# Patient Record
Sex: Male | Born: 1955 | Race: White | Hispanic: No | Marital: Married | Smoking: Former smoker
Health system: Southern US, Community
[De-identification: ages and names within clinical notes are randomized; demographics above are authoritative.]

## PROBLEM LIST (undated history)

## (undated) DIAGNOSIS — N400 Enlarged prostate without lower urinary tract symptoms: Secondary | ICD-10-CM

## (undated) DIAGNOSIS — Z789 Other specified health status: Secondary | ICD-10-CM

## (undated) DIAGNOSIS — D3610 Benign neoplasm of peripheral nerves and autonomic nervous system, unspecified: Secondary | ICD-10-CM

## (undated) DIAGNOSIS — E785 Hyperlipidemia, unspecified: Secondary | ICD-10-CM

## (undated) HISTORY — DX: Benign prostatic hyperplasia without lower urinary tract symptoms: N40.0

## (undated) HISTORY — PX: COLONOSCOPY: SHX174

## (undated) HISTORY — PX: PROSTATE SURGERY: SHX751

## (undated) HISTORY — PX: ELBOW SURGERY: SHX618

## (undated) HISTORY — PX: SEPTOPLASTY: SUR1290

## (undated) HISTORY — DX: Benign neoplasm of peripheral nerves and autonomic nervous system, unspecified: D36.10

## (undated) HISTORY — PX: NASAL SINUS SURGERY: SHX719

---

## 2000-08-05 ENCOUNTER — Ambulatory Visit (HOSPITAL_BASED_OUTPATIENT_CLINIC_OR_DEPARTMENT_OTHER): Admission: RE | Admit: 2000-08-05 | Discharge: 2000-08-05 | Payer: Self-pay | Admitting: Orthopedic Surgery

## 2003-06-11 ENCOUNTER — Inpatient Hospital Stay (HOSPITAL_COMMUNITY): Admission: EM | Admit: 2003-06-11 | Discharge: 2003-06-13 | Payer: Self-pay | Admitting: Emergency Medicine

## 2007-07-30 ENCOUNTER — Ambulatory Visit: Payer: Self-pay | Admitting: Gastroenterology

## 2007-08-10 ENCOUNTER — Ambulatory Visit: Payer: Self-pay | Admitting: Gastroenterology

## 2007-08-10 ENCOUNTER — Encounter: Payer: Self-pay | Admitting: Gastroenterology

## 2007-08-11 ENCOUNTER — Encounter: Payer: Self-pay | Admitting: Gastroenterology

## 2007-09-07 ENCOUNTER — Ambulatory Visit: Payer: Self-pay | Admitting: Gastroenterology

## 2007-09-07 DIAGNOSIS — F411 Generalized anxiety disorder: Secondary | ICD-10-CM | POA: Insufficient documentation

## 2007-09-07 DIAGNOSIS — D179 Benign lipomatous neoplasm, unspecified: Secondary | ICD-10-CM | POA: Insufficient documentation

## 2007-09-07 DIAGNOSIS — R933 Abnormal findings on diagnostic imaging of other parts of digestive tract: Secondary | ICD-10-CM | POA: Insufficient documentation

## 2007-09-09 ENCOUNTER — Ambulatory Visit: Payer: Self-pay | Admitting: Internal Medicine

## 2007-09-10 ENCOUNTER — Encounter: Payer: Self-pay | Admitting: Gastroenterology

## 2008-09-09 ENCOUNTER — Telehealth (INDEPENDENT_AMBULATORY_CARE_PROVIDER_SITE_OTHER): Payer: Self-pay | Admitting: *Deleted

## 2008-09-12 ENCOUNTER — Encounter (INDEPENDENT_AMBULATORY_CARE_PROVIDER_SITE_OTHER): Payer: Self-pay | Admitting: *Deleted

## 2009-06-30 ENCOUNTER — Ambulatory Visit (HOSPITAL_COMMUNITY): Payer: Self-pay | Admitting: Licensed Clinical Social Worker

## 2009-07-05 ENCOUNTER — Ambulatory Visit (HOSPITAL_COMMUNITY): Payer: Self-pay | Admitting: Licensed Clinical Social Worker

## 2009-08-01 ENCOUNTER — Ambulatory Visit (HOSPITAL_COMMUNITY): Payer: Self-pay | Admitting: Licensed Clinical Social Worker

## 2009-10-19 ENCOUNTER — Telehealth: Payer: Self-pay | Admitting: Gastroenterology

## 2009-10-24 ENCOUNTER — Ambulatory Visit: Payer: Self-pay | Admitting: Internal Medicine

## 2009-10-27 ENCOUNTER — Ambulatory Visit: Payer: Self-pay | Admitting: Internal Medicine

## 2009-11-06 ENCOUNTER — Ambulatory Visit: Payer: Self-pay | Admitting: Gastroenterology

## 2010-03-22 NOTE — Progress Notes (Signed)
Summary: Repeat CT?  Phone Note Call from Patient Call back at Work Phone [phone]   Call For: Dr Christella Hartigan Summary of Call: Wants to know if he needs to have a Ct and will it be done that same day of his rov 10-27-09? Initial call taken by: Leanor Kail [facility],  October 19, 2009 10:03 AM  Follow-up for Phone Call        pt scheduled for CT and order faxed he will pick up contrast today Follow-up by: Chales Abrahams CMA Duncan Dull),  October 19, 2009 10:19 AM

## 2010-03-22 NOTE — Assessment & Plan Note (Signed)
  Review of gastrointestinal problems: 1. Routine risk for colon cancer; colonoscopy 2009 found no adenomatous polyps. Next colonoscopy 2019. 2. 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 Serial imaging by CT scan has shown no growth up to August 2011 (last CT).    History of Present Illness Visit Type: Follow-up Visit Primary GI MD: Rob Bunting MD Primary Provider: Bradd Canary, MD Requesting Provider: Bradd Canary, MD Chief Complaint: Discuss possible repeat CT scan History of Present Illness:     no bleeding, no constipation.  No bowel changes.  NO abd pains.  Overall has gained  about 20 pounds in 2 years.           Current Medications (verified): 1)  Adult Aspirin Ec Low Strength 81 Mg  Tbec (Aspirin) .... Take 1 Tablet By Mouth Once A Day 2)  Zocor 40 Mg  Tabs (Simvastatin) .... Take 1 Tablet By Mouth Once A Day 3)  Melatonin 3 Mg Tabs (Melatonin) .Marland Kitchen.. 1 By Mouth Once Daily  Allergies (verified): No Known Drug Allergies  Vital Signs:  Patient profile:   55 year old male Height:      73 inches Weight:      208 pounds BMI:     27.54 BSA:     2.19 Pulse rate:   76 / minute Pulse rhythm:   regular BP sitting:   94 / 60  (left arm)  Vitals Entered By: Merri Ray CMA Duncan Dull) (November 06, 2009 3:44 PM)  Physical Exam  Additional Exam:  Constitutional: generally well appearing Psychiatric: alert and oriented times 3 Abdomen: soft, non-tender, non-distended, normal bowel sounds    Impression & Recommendations:  Problem # 1:  asymptomatic sigmoid colon lipoma D. lipoma has not changed in size in 2 years since it first was noted. He has no symptoms from it. I recommended we just simply follow him clinically. I reviewed the images with him from his recent CT scan. He will be undergoing a repeat screening colonoscopy in about 8 years from now I noticed a call if he has any concerning bowel changes prior to then.  Patient  Instructions: 1)  Call Dr. Christella Hartigan' office with any concerning bowel issues, changes. 2)  A copy of this information will be sent to Dr. Artis Flock. 3)  The medication list was reviewed and reconciled.  All changed / newly prescribed medications were explained.  A complete medication list was provided to the patient / caregiver.

## 2010-07-06 NOTE — Op Note (Signed)
[place]. [facility]  Patient:    [name], [name]                      MRN: [id number] Proc. Date: 08/05/00 Attending:  Jearld Adjutant, M.D.                           Operative Report  PREOPERATIVE DIAGNOSIS:  Chronic right elbow lateral epicondylitis.  POSTOPERATIVE DIAGNOSIS:  Chronic right elbow lateral epicondylitis.  PROCEDURE:  ECRB scar excision right lateral elbow with lateral epicondylar drilling and partial ostectomy.  SURGEON:  Jearld Adjutant, M.D.  ASSISTANT:  Darien Ramus, P.A.-C.  ANESTHESIA:  General with LMA.  CULTURES:  None.  DRAINS:  None.  ESTIMATED BLOOD LOSS:  Minimal.  TOURNIQUET TIME:  30 minutes.  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  X-rays were negative.  He was injected with cortisone and placed in a tennis elbow strap, and started on icedowns.  He came back on March 21, 1999, and was improved.  By May 16, 1999, he was reinjected because it was beginning to hurt.  By June 13, 1999, he was improved.  By January 02, 2000, he was having recurrent pain.  We talked about the procedure.  He did get a second opinion and they advised one more injection which was done on January 23, 2000. He improved again, but then continued to have discomfort and elected to proceed with surgery.  PATHOLOGY:  At surgery, classic findings of ECRB were noted which was excised over about a 2.5 cm length.  We then did a partial ostectomy on the very prominent lateral epicondyle, freshened it with a rongeur to stimulate neovascularity as well as drilling.  Also I took a small segment of the annular ligament of the radial head.  DESCRIPTION OF PROCEDURE:  With adequate anesthesia obtained using LMA technique, 1 gram Ancef given IV prophylaxis, the patient was placed in the supine position.  The right upper extremity was  prepped from the fingertips to the tourniquet in the standard fashion.  After standard prepping and draping, Esmarch exsanguination was used.  The tourniquet was let up to 250 mmHg. Hockeystick incision was then made based over the lateral epicondyle. Incision was deepened sharply with the knife and hemostasis obtained using the bovie electrocoagulater.  Dissection was then carried down longitudinally over the lateral epicondylar ridge and then distally at a slight angle incising the ECRL tendon.  This exposed the ECRB which was then excised off the lateral epicondyle and to the radial neck.  We then excised the ECRB in total.  I smoothed the lateral epicondyle with the rongeur, osteotome, and then drilled it, actually it was not smoothing as much as it was roughening up the lateral cortex to freshen the area for reattachment.  We then multiply drilled it, irrigated the wound, closed the ECRL with a running locked 0 Vicryl, subcutaneous adipose was closed with 3-0 Vicryl and the skin with a running 4-0 Monocryl and Steri-Strips.  A bulky sterile compressive dressing was applied with Ace from palm to shoulder with sling.  The patient having tolerated the procedure well was awakened and taken to the recovery room in satisfactory condition to be discharged per outpatient routine.  He was told to call the office for appointment for recheck on Saturday. DD:  08/05/00 TD:  08/05/00 Job: [id number]

## 2010-07-06 NOTE — Op Note (Signed)
NAMEFIELDING, MAULT                         ACCOUNT NO.:  [id number]   MEDICAL RECORD NO.:  [id number]                   PATIENT TYPE:  INP   LOCATION:  5725                                 FACILITY:  [facility]   PHYSICIAN:  Karol T. Lazarus Salines, M.D.              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   POSTOPERATIVE DIAGNOSIS:  Left frontal ethmoid mucopyocele.   PROCEDURE PERFORMED:  Endoscopic revision left anterior ethmoidectomy and  frontal sinus exploration.   SURGEON:  Dr. Lazarus Salines.   ANESTHESIA:  General orotracheal.   BLOOD LOSS:  Minimal.   COMPLICATIONS:  Violation of the lamina papyracea with exposure of the  orbital fat.   FINDINGS:  A bony attachment at the mid portion of the anterior middle  turbinate which with infracture of the turbinate exposed the orbital fat.  This was probed, and no mucus contents were identified.  A gray-white  mucopus-containing mucocele cell above this area with blockage of an  anterior mid ethmoid cell.  By x-rays, blockage of the supraorbital ethmoid  as well.   PROCEDURE:  With the patient in a comfortable supine position, general  orotracheal anesthesia was induced without difficulty.  At an appropriate  level, the patient was placed in a slight sitting position.  A saline-  moistened throat pack was placed.  Nasal vibrissa were trimmed.  Cocaine  crystals, 200 mg total were applied on cotton carriers to the anterior  ethmoid region on both sides and into the sphenopalatine ganglion region.  Cocaine solution, 160 mg total was applied on 0.5 x 3 inch cottonoids, three  total to the left septum and one to the high right septum.  Finally, 1%  Xylocaine with 1:100,000 epinephrine, 8 mL total was infiltrated into the  submucoperichondral plane of the  left septum, into the anterior pole of the  inferior and middle turbinates.  Several minutes were allowed for this to  take effect.  External preparation and draping was accomplished.  The CT  scans were reviewed.   The materials were removed from the nose and observed to be intact and  correct in number.  Using a 25 gauge spinal needle under direct  visualization, additional 1% Xylocaine with 1:100,000 epinephrine, 4 mL  total, was infiltrated into the middle turbinate and entered the anterior  lateral wall of the nose in the middle meatus.  Additional 4% cocaine  solution was applied on 0.5 x 3 inch cottonoids between the middle turbinate  and the septum, and up into the middle meatus after first infracturing the  turbinate.   After waiting several more minutes, and reviewing the CT scans once again,  the materials were removed from the nose and observed to be intact and  correct  in number.  The turbinate had been infractured.  The bone in the  agger nasi region was probed with a freer elevator and elevated.  What  looked to be some gelatinous, edematous mucosa was identified.  Compression  of the orbit did not show any bulging.  A small biopsy was taken.  Given the  consistency of the material, compression on the orbit once again was  performed, and this time some bulging was noted, and this was thought to be  consistent with periorbital fat.  No further manipulation in this area was  performed.  Superior to this area, using the freer elevator, bone was  elevated up toward the agger nasi, and a mucocele content cavity was opened.  Some mucopus was suctioned free and sent for culture and sensitivity.  Using  a __________ tip suction, the confines of this opening were enlarged  posteriorly and anteriorly, medially and laterally, and bone chips were  removed after first bluntly dissecting.  A moderately thin mucosa was  identified throughout.  Upon inspecting the cavity with a 0-degree   endoscope, the 30-degree endoscope, and finally the 70-degree endoscope,  clean bony walls were identified.  With the seeker, compression on the  lamina papyracea and mucocele cavity revealed the soft area as identified on  the x-rays.  With careful palpation, an additional ethmoid cell was  identified directly posterior to the mucocele and with gently palpation, the  bone was opened and debrided to free this cell.  After multiple attempts to  visualize with the 0, 30, and 70-degree endoscopes into the lateral and  anterosuperior angle, it was not felt possible to determine whether the  frontal cell had been successfully communicated with the supraorbital  ethmoid cell.  Some portion of the anterior beak was successfully opened  using Hajek forceps.  Again, no additional cells could be visualized or  identified.  At this point, with the primary mucocele having been identified  and opened, no further attention was felt required.  Hemostasis was  observed.  A small piece of Gelfilm was applied against the exposed  periorbita.  A triple-thickness Neosporin-impregnated Telfa pack with a 2-0  silk tag was placed up into the middle meatus and held in place by the  middle turbinate for hemostasis.  Hemostasis was observed.  The nose was  suctioned free.  The pharynx was suctioned free, and the throat pack was  removed.  A drip pad was applied.  The patient was returned to anesthesia,  awakened, extubated, and transferred to recovery in stable condition.   Comment:  A 55 year old white male, 9 years status post left anterior  ethmoidectomy and antrostomy has had a 2 day history of intense and  progressive left medial periorbital pain.  Ophthalmologic evaluation was  clear.  A CT scan showed a mucocele of the left frontal sinus with  obstruction of a supraorbital ethmoid cell secondary to the expansion of the frontal sinus, hence the indication for today's procedure.  There was an  area of bone  thinning or probable dehiscence between the mucocele cavity and  the lamina papyracea and periorbita which was inadvertently opened  surgically but then carefully avoided.   We will cover him with antibiotics and will observe him overnight.  We will  remove the packing in 24 hours.  I anticipate that this will solve his acute  situation.  When his surgical resolution has occurred, I will need to repeat  the CT scan to see what the status  of the supraorbital ethmoid cell is.  If  this is not adequately drained, then we will need to do a full frontal sinus  trephine to further communicate this with the frontal sinus for adequate  drainage into the nose.  Meanwhile, we will plan routine postoperative care  including analgesia, antibiosis, ice, elevation, analgesia.                                               Gloris Manchester. Lazarus Salines, M.D.    KTW/MEDQ  D:  06/11/2003  T:  06/13/2003  Job:  [id number]   cc:   Casimiro Needle A. Karleen Hampshire, M.D.  [address], [place]  [place]  [place] [address]  Fax: [phone]

## 2010-07-06 NOTE — Consult Note (Signed)
NAMEDEMONIE, KASSA                         ACCOUNT NO.:  [id number]   MEDICAL RECORD NO.:  [id number]                   PATIENT TYPE:  INP   LOCATION:  5725                                 FACILITY:  [facility]   PHYSICIAN:  Karol T. Lazarus Salines, M.D.              DATE OF BIRTH:  [birth year]   DATE OF CONSULTATION:  DATE OF DISCHARGE:                                   CONSULTATION   CHIEF COMPLAINT:  Severe left eye region pain.   HISTORY:  A 55 year old white male who had gradual progressive pain and  diagnosed sinus infection back in the late 90's.  He was noted to have  polyps.  He underwent some sort of a left endoscopic sinus procedure with  Dr. Lyman Bishop.  The old records are not yet available.  The patient did  nicely afterwards, with no evidence of recurrent infection or residual  polyps.  He has used Flonase nasal spray on rare occasion, but no  significant history of infection of allergies since that time.   In the summer of 2004, he took some Flonase to help with congestion  associated with an Theatre manager which included some left periorbital  region discomfort, and which also settled very promptly.  Beginning two days  ago, sitting at a meeting at work, he detected increasing severe medial left  orbital pain.  He was seen by the plant doctor and then Dr. Karna Christmas who also  served as the plant doctor.  Dr. Karna Christmas, an otolaryngologist, thought he had  an eye problem, and he was sent to see an ophthalmologist, and from the  ophthalmologist, to see Dr. Karleen Hampshire for a neural ophthalmologic evaluation.  Dr. Karleen Hampshire thought the eye was fine, and sent him for a CT scan which  showed a significant sinus disease with expansion of lamina papyracea into  the left orbit.  The patient was told to contact me last evening for further  attention, but basically did not follow through.  He was given Cipro and  Vicodin at that time which is helping somewhat.  He has had no fever, no  photophobia, no  diplopia, no headache or stiff neck.  He has no history of  HIV, prednisone therapy or other immune issues including diabetes.  He is  otherwise basically healthy.   ALLERGIES:  No known medical allergies.   MEDICATIONS:  He is taking Zocor for cholesterol.  He had elbow repair and  previous sinus surgery.  No other current active medical conditions.   SOCIAL HISTORY:  He works as a Psychologist, counselling.  He is married with  children.  He does not smoke.   FAMILY HISTORY:  Noncontributory.   REVIEW OF SYSTEMS:  He takes one baby aspirin daily, but no known bleeding  tendencies.  No personal or family history of bleeding or anesthesia  reactions.   PHYSICAL EXAMINATION:  GENERAL:  This is a trim, healthy-appearing adult  white  male who appears somewhat anxious and also somewhat distressed.  Mental status is appropriate.  He hears well in conversational speech.  Voice is clear and respirations are unlabored through the nose.  NECK:  Supple.  HEENT:  Head atraumatic. Cranial nerves are intact including full range of  motion in each eye without disconjugate gaze or subjective diplopia.  He has  slight anisocoria with the left pupil slightly larger than the right.  He  was dilated in the ophthalmologist's office yesterday and took some Vicodin  this morning.  No apparent mal positioning of the globes.  Subjectively  intact vision each eye.  Ear canals are clear with normal aerated drums.  Anterior nose is slightly narrow on both sides with mild leftward septal  deviation.  No active drainage or polyps visible.  Oral cavity is clear with  most membranes and teeth in good repair.  Oropharynx clear with no residual  drainage.  I did not examine nasopharynx or hypopharynx.  Neck unremarkable.   X-RAY:  Axial and coronal CT scans of the paranasal sinuses done at [facility] yesterday show an expansile frontal sinus crossing the midline,  compressing the lateral recess of the supraorbital  ethmoid cell on the left  side and with expansion of the inferior anterior laminar papyracea near the  lacrimal sac, and possible bone dehiscence in this area.  The supraorbital  ethmoid cells also opacified.  He has surgical changes and a relatively  narrow left ethmoid block, and opening of the ostial meatal complex with a  clear antrum.  The remainder of the sinuses are clear.   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   PLAN:  I think this has been lucky that it has not got more significantly  infected, but he does need to have an emergent exenteration of the mucocele  to prevent further complications.  I discussed this carefully with the  patient and his wife.  His questions were answered, and informed consent  obtained.  A routine preoperative history and physical was recorded without  contraindications.  We will remove the nasal packing in one to two days  following surgery.  He has Cipro from Dr. Karleen Hampshire.  I gave him additional  prescriptions for Vicodin and Tylox.  We will observe him overnight to make  sure he is not having complications, and then let him go home tomorrow if he  is doing nicely.                                               Gloris Manchester. Lazarus Salines, M.D.    KTW/MEDQ  D:  06/11/2003  T:  06/12/2003  Job:  [id number]   cc:   Casimiro Needle A. Karleen Hampshire, M.D.  [address] [place]  [place]  [place] [address]  Fax: [phone]

## 2011-02-19 HISTORY — PX: FINGER SURGERY: SHX640

## 2011-06-28 ENCOUNTER — Other Ambulatory Visit: Payer: Self-pay | Admitting: Orthopedic Surgery

## 2011-06-28 MED ORDER — HYDROMORPHONE HCL PF 1 MG/ML IJ SOLN
INTRAMUSCULAR | Status: AC
  Filled 2011-06-28: qty 1

## 2011-06-28 MED ORDER — MORPHINE SULFATE (PF) 1 MG/ML IV SOLN
INTRAVENOUS | Status: AC
  Filled 2011-06-28: qty 25

## 2011-07-01 ENCOUNTER — Encounter (HOSPITAL_BASED_OUTPATIENT_CLINIC_OR_DEPARTMENT_OTHER): Payer: Self-pay | Admitting: *Deleted

## 2011-07-01 NOTE — Progress Notes (Signed)
No labs needed

## 2011-07-03 NOTE — H&P (Signed)
  [name] is an 56 y.o. male.   Chief Complaint: c/o persistent pain and stiffness right index finger HPI: On May 10, 2011 [name] accidentally slammed his right index finger against a bed headboard that the was assembling creasing an ulnar deviation injury to the metacarpophalangeal joint. He had immediate pain, felt a pop, had an ecchymosis develop immediately and had enough pain to "see stars".  He subsequently has been protecting his hand and working on range of motion exercises.    He was seen at the [facility] on May 11, 2011 and had x-rays obtained.  He brings a copy of his x-rays on disk. There is no evidence of fracture.  He was referred for follow-up at your office.  He has been resting, but has persistent swelling, ulnar deviation of the finger and cannot make a tight fist.  He cannot perform key or chuck pinch without significant pain.    Past Medical History  Diagnosis Date  . Hyperlipemia   . No pertinent past medical history     Past Surgical History  Procedure Date  . Septoplasty   . Nasal sinus surgery     x3  . Elbow surgery     right-tennis elbow  . Colonoscopy     No family history on file. Social History:  reports that he has been smoking Cigars.  He does not have any smokeless tobacco history on file. He reports that he drinks alcohol. He reports that he does not use illicit drugs.  Allergies: No Known Allergies  No prescriptions prior to admission    No results found for this or any previous visit (from the past 48 hour(s)).  No results found.   Pertinent items are noted in HPI.  Height 6\' 1"  (1.854 m), weight 81.647 kg (180 lb).  General appearance: alert Head: Normocephalic, without obvious abnormality Neck: supple, symmetrical, trachea midline Resp: clear to auscultation bilaterally Cardio: regular rate and rhythm GI: normal findings: bowel sounds normal Extremities:.  Inspection of his hand reveals obvious  swelling of the index MP joint.  His range of motion reveals hyperextension 40, further flexion 80 with pain at the limits of motion in extension and flexion vs. hyperextension 60, further flexion 95 on the left. He has no end point on testing of the radial collateral ligament of his index MP joint.  Clinical examination reveals what appears to be a small degree of palmar subluxation and ulnar rotation of the proximal phalanx off the metacarpal head.    By palpation he is noted to be most tender at the origin of the radial collateral ligament at the index metacarpophalangeal joint.    His x-rays from 05/11/11 were reviewed.  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    PLAN:  I had a detailed discussion with [name] for more than 40 minutes regarding reconstruction of his finger.  This is a very complex injury.    He would benefit from exploration of his joint and either delayed primary repair or reconstruction of his radial collateral ligament utilizing a palmaris longus tendon graft.      DASNOIT,Abdou Stocks J 07/03/2011, 4:49 PM

## 2011-07-04 ENCOUNTER — Encounter (HOSPITAL_BASED_OUTPATIENT_CLINIC_OR_DEPARTMENT_OTHER): Payer: Self-pay | Admitting: Anesthesiology

## 2011-07-04 ENCOUNTER — Ambulatory Visit (HOSPITAL_BASED_OUTPATIENT_CLINIC_OR_DEPARTMENT_OTHER): Payer: BC Managed Care – PPO | Admitting: Anesthesiology

## 2011-07-04 ENCOUNTER — Ambulatory Visit (HOSPITAL_BASED_OUTPATIENT_CLINIC_OR_DEPARTMENT_OTHER)
Admission: RE | Admit: 2011-07-04 | Discharge: 2011-07-04 | Disposition: A | Discharge status: Home / Self Care | Payer: BC Managed Care – PPO | Source: Ambulatory Visit | Attending: Orthopedic Surgery | Admitting: Orthopedic Surgery

## 2011-07-04 ENCOUNTER — Encounter (HOSPITAL_BASED_OUTPATIENT_CLINIC_OR_DEPARTMENT_OTHER)
Admission: RE | Disposition: A | Discharge status: Home / Self Care | Payer: Self-pay | Source: Ambulatory Visit | Attending: Orthopedic Surgery

## 2011-07-04 ENCOUNTER — Encounter (HOSPITAL_BASED_OUTPATIENT_CLINIC_OR_DEPARTMENT_OTHER): Payer: Self-pay

## 2011-07-04 DIAGNOSIS — Y998 Other external cause status: Secondary | ICD-10-CM | POA: Insufficient documentation

## 2011-07-04 DIAGNOSIS — S63659A Sprain of metacarpophalangeal joint of unspecified finger, initial encounter: Secondary | ICD-10-CM | POA: Insufficient documentation

## 2011-07-04 DIAGNOSIS — W230XXA Caught, crushed, jammed, or pinched between moving objects, initial encounter: Secondary | ICD-10-CM | POA: Insufficient documentation

## 2011-07-04 DIAGNOSIS — E785 Hyperlipidemia, unspecified: Secondary | ICD-10-CM | POA: Insufficient documentation

## 2011-07-04 HISTORY — DX: Other specified health status: Z78.9

## 2011-07-04 HISTORY — DX: Hyperlipidemia, unspecified: E78.5

## 2011-07-04 LAB — POCT HEMOGLOBIN-HEMACUE: Hemoglobin: 15.4 g/dL (ref 13.0–17.0)

## 2011-07-04 SURGERY — REPAIR, LIGAMENT, ULNAR COLLATERAL
Anesthesia: General | Site: Finger | Laterality: Right | Wound class: Clean

## 2011-07-04 MED ORDER — FENTANYL CITRATE 0.05 MG/ML IJ SOLN
INTRAMUSCULAR | Status: DC | PRN
  Administered 2011-07-04: 100 ug via INTRAVENOUS
  Administered 2011-07-04: 50 ug via INTRAVENOUS

## 2011-07-04 MED ORDER — LIDOCAINE HCL (CARDIAC) 20 MG/ML IV SOLN
INTRAVENOUS | Status: DC | PRN
  Administered 2011-07-04: 100 mg via INTRAVENOUS

## 2011-07-04 MED ORDER — ONDANSETRON HCL 4 MG/2ML IJ SOLN
INTRAMUSCULAR | Status: DC | PRN
  Administered 2011-07-04: 4 mg via INTRAVENOUS

## 2011-07-04 MED ORDER — LIDOCAINE HCL 2 % IJ SOLN
INTRAMUSCULAR | Status: DC | PRN
  Administered 2011-07-04: 7 mL

## 2011-07-04 MED ORDER — CEPHALEXIN 500 MG PO CAPS: 500.0000 mg | ORAL_CAPSULE | Freq: Three times a day (TID) | ORAL | Status: AC

## 2011-07-04 MED ORDER — OXYCODONE-ACETAMINOPHEN 5-325 MG PO TABS: ORAL_TABLET | ORAL | Status: DC

## 2011-07-04 MED ORDER — FENTANYL CITRATE 0.05 MG/ML IJ SOLN: 25.0000 ug | INTRAMUSCULAR | Status: DC | PRN

## 2011-07-04 MED ORDER — MIDAZOLAM HCL 5 MG/5ML IJ SOLN
INTRAMUSCULAR | Status: DC | PRN
  Administered 2011-07-04: 2 mg via INTRAVENOUS

## 2011-07-04 MED ORDER — CHLORHEXIDINE GLUCONATE 4 % EX LIQD: 60.0000 mL | Freq: Once | CUTANEOUS | Status: DC

## 2011-07-04 MED ORDER — DEXAMETHASONE SODIUM PHOSPHATE 4 MG/ML IJ SOLN
INTRAMUSCULAR | Status: DC | PRN
  Administered 2011-07-04: 10 mg via INTRAVENOUS

## 2011-07-04 MED ORDER — OXYCODONE-ACETAMINOPHEN 5-325 MG PO TABS
1.0000 | ORAL_TABLET | ORAL | Status: DC | PRN
  Administered 2011-07-04: 2 via ORAL

## 2011-07-04 MED ORDER — PROPOFOL 10 MG/ML IV EMUL
INTRAVENOUS | Status: DC | PRN
  Administered 2011-07-04: 200 mg via INTRAVENOUS

## 2011-07-04 MED ORDER — 0.9 % SODIUM CHLORIDE (POUR BTL) OPTIME
TOPICAL | Status: DC | PRN
  Administered 2011-07-04: 100 mL

## 2011-07-04 MED ORDER — LACTATED RINGERS IV SOLN
INTRAVENOUS | Status: DC
  Administered 2011-07-04 (×2): via INTRAVENOUS

## 2011-07-04 MED ORDER — METOCLOPRAMIDE HCL 5 MG/ML IJ SOLN: 10.0000 mg | Freq: Once | INTRAMUSCULAR | Status: DC | PRN

## 2011-07-04 MED ORDER — IBUPROFEN 600 MG PO TABS: 600.0000 mg | ORAL_TABLET | Freq: Four times a day (QID) | ORAL | Status: AC | PRN

## 2011-07-04 SURGICAL SUPPLY — 72 items
BANDAGE ADHESIVE 1X3 (GAUZE/BANDAGES/DRESSINGS) IMPLANT
BANDAGE ELASTIC 3 VELCRO ST LF (GAUZE/BANDAGES/DRESSINGS) ×2 IMPLANT
BANDAGE ELASTIC 4 VELCRO ST LF (GAUZE/BANDAGES/DRESSINGS) IMPLANT
BANDAGE GAUZE ELAST BULKY 4 IN (GAUZE/BANDAGES/DRESSINGS) IMPLANT
BLADE MINI RND TIP GREEN BEAV (BLADE) ×2 IMPLANT
BLADE SURG 15 STRL LF DISP TIS (BLADE) ×1 IMPLANT
BLADE SURG 15 STRL SS (BLADE) ×1
BNDG ELASTIC 2 VLCR STRL LF (GAUZE/BANDAGES/DRESSINGS) ×2 IMPLANT
BNDG ESMARK 4X9 LF (GAUZE/BANDAGES/DRESSINGS) ×2 IMPLANT
BRUSH SCRUB EZ PLAIN DRY (MISCELLANEOUS) ×2 IMPLANT
CANISTER SUCTION 1200CC (MISCELLANEOUS) ×2 IMPLANT
CATH ROBINSON RED A/P 10FR (CATHETERS) IMPLANT
CATH ROBINSON RED A/P 12FR (CATHETERS) IMPLANT
CLOTH BEACON ORANGE TIMEOUT ST (SAFETY) ×2 IMPLANT
CORDS BIPOLAR (ELECTRODE) ×2 IMPLANT
COVER MAYO STAND STRL (DRAPES) ×2 IMPLANT
COVER TABLE BACK 60X90 (DRAPES) ×2 IMPLANT
CUFF TOURNIQUET SINGLE 18IN (TOURNIQUET CUFF) ×2 IMPLANT
DECANTER SPIKE VIAL GLASS SM (MISCELLANEOUS) ×2 IMPLANT
DRAPE EXTREMITY T 121X128X90 (DRAPE) ×2 IMPLANT
DRAPE OEC MINIVIEW 54X84 (DRAPES) ×2 IMPLANT
DRAPE SURG 17X23 STRL (DRAPES) ×2 IMPLANT
GLOVE BIO SURGEON STRL SZ 6.5 (GLOVE) ×2 IMPLANT
GLOVE BIOGEL M STRL SZ7.5 (GLOVE) ×2 IMPLANT
GLOVE BIOGEL PI IND STRL 7.0 (GLOVE) ×1 IMPLANT
GLOVE BIOGEL PI INDICATOR 7.0 (GLOVE) ×1
GLOVE EXAM NITRILE MD LF STRL (GLOVE) ×2 IMPLANT
GLOVE ORTHO TXT STRL SZ7.5 (GLOVE) ×2 IMPLANT
GOWN PREVENTION PLUS XLARGE (GOWN DISPOSABLE) ×2 IMPLANT
GOWN PREVENTION PLUS XXLARGE (GOWN DISPOSABLE) ×4 IMPLANT
KWIRE 4.0 X .035IN (WIRE) ×2 IMPLANT
KWIRE 4.0 X .045IN (WIRE) ×2 IMPLANT
NEEDLE 27GAX1X1/2 (NEEDLE) ×2 IMPLANT
NEEDLE ADDISON D1/2 CIR (NEEDLE) IMPLANT
NEEDLE KEITH (NEEDLE) IMPLANT
NEEDLE KEITH SZ10 STRAIGHT (NEEDLE) ×2 IMPLANT
NS IRRIG 1000ML POUR BTL (IV SOLUTION) ×2 IMPLANT
PACK BASIN DAY SURGERY FS (CUSTOM PROCEDURE TRAY) ×2 IMPLANT
PAD CAST 3X4 CTTN HI CHSV (CAST SUPPLIES) ×2 IMPLANT
PAD CAST 4YDX4 CTTN HI CHSV (CAST SUPPLIES) IMPLANT
PADDING CAST ABS 4INX4YD NS (CAST SUPPLIES)
PADDING CAST ABS COTTON 4X4 ST (CAST SUPPLIES) IMPLANT
PADDING CAST COTTON 3X4 STRL (CAST SUPPLIES) ×2
PADDING CAST COTTON 4X4 STRL (CAST SUPPLIES)
PADDING UNDERCAST 2  STERILE (CAST SUPPLIES) IMPLANT
PASSER SUT SWANSON 36MM LOOP (INSTRUMENTS) IMPLANT
SLEEVE SCD COMPRESS KNEE MED (MISCELLANEOUS) IMPLANT
SPLINT PLASTER CAST XFAST 3X15 (CAST SUPPLIES) ×10 IMPLANT
SPLINT PLASTER XTRA FASTSET 3X (CAST SUPPLIES) ×10
SPONGE GAUZE 4X4 12PLY (GAUZE/BANDAGES/DRESSINGS) ×2 IMPLANT
STOCKINETTE 4X48 STRL (DRAPES) ×2 IMPLANT
STRIP CLOSURE SKIN 1/2X4 (GAUZE/BANDAGES/DRESSINGS) ×2 IMPLANT
SUCTION FRAZIER TIP 10 FR DISP (SUCTIONS) IMPLANT
SUT ETHIBOND 3-0 V-5 (SUTURE) IMPLANT
SUT ETHILON 4 0 PS 2 18 (SUTURE) IMPLANT
SUT FIBERWIRE 3-0 18 TAPR NDL (SUTURE) ×2
SUT MERSILENE 4 0 P 3 (SUTURE) ×4 IMPLANT
SUT MERSILENE 6 0 P 1 (SUTURE) IMPLANT
SUT POLY BUTTON 15MM (SUTURE) IMPLANT
SUT PROLENE 3 0 PS 2 (SUTURE) ×2 IMPLANT
SUT PROLENE 4 0 P 3 18 (SUTURE) IMPLANT
SUT VIC AB 4-0 P-3 18XBRD (SUTURE) IMPLANT
SUT VIC AB 4-0 P3 18 (SUTURE)
SUTURE FIBERWR 3-0 18 TAPR NDL (SUTURE) ×1 IMPLANT
SYR 3ML 23GX1 SAFETY (SYRINGE) IMPLANT
SYR BULB 3OZ (MISCELLANEOUS) ×2 IMPLANT
SYR CONTROL 10ML LL (SYRINGE) ×2 IMPLANT
TOWEL OR 17X24 6PK STRL BLUE (TOWEL DISPOSABLE) ×2 IMPLANT
TRAY DSU PREP LF (CUSTOM PROCEDURE TRAY) ×2 IMPLANT
TUBE CONNECTING 20X1/4 (TUBING) IMPLANT
UNDERPAD 30X30 INCONTINENT (UNDERPADS AND DIAPERS) ×2 IMPLANT
WATER STERILE IRR 1000ML POUR (IV SOLUTION) ×2 IMPLANT

## 2011-07-04 NOTE — Op Note (Signed)
OP NOTE DICTATED [id number]

## 2011-07-04 NOTE — Discharge Instructions (Signed)

## 2011-07-04 NOTE — Transfer of Care (Signed)
Immediate Anesthesia Transfer of Care Note  Patient: [name]  Procedure(s) Performed: Procedure(s) (LRB): ULNAR COLLATERAL LIGAMENT REPAIR (Right)  Patient Location: PACU  Anesthesia Type: General  Level of Consciousness: sedated  Airway & Oxygen Therapy: Patient Spontanous Breathing and Patient connected to face mask oxygen  Post-op Assessment: Report given to PACU RN and Post -op Vital signs reviewed and stable  Post vital signs: Reviewed and stable  Complications: No apparent anesthesia complications

## 2011-07-04 NOTE — Anesthesia Postprocedure Evaluation (Signed)
Anesthesia Post Note  Patient: [name]  Procedure(s) Performed: Procedure(s) (LRB): ULNAR COLLATERAL LIGAMENT REPAIR (Right)  Anesthesia type: General  Patient location: PACU  Post pain: Pain level controlled  Post assessment: Patient's Cardiovascular Status Stable  Last Vitals:  Filed Vitals:   07/04/11 1200  BP:   Pulse: 58  Temp:   Resp: 15    Post vital signs: Reviewed and stable  Level of consciousness: alert  Complications: No apparent anesthesia complications

## 2011-07-04 NOTE — Anesthesia Procedure Notes (Addendum)
Performed by: Caren Macadam    Date/Time: 07/04/2011 9:57 AM Performed by: Caren Macadam    Procedure Name: LMA Insertion Date/Time: 07/04/2011 9:57 AM Performed by: Caren Macadam Pre-anesthesia Checklist: Patient identified, Emergency Drugs available, Suction available, Patient being monitored and Timeout performed Patient Re-evaluated:Patient Re-evaluated prior to inductionOxygen Delivery Method: Circle System Utilized Preoxygenation: Pre-oxygenation with 100% oxygen Intubation Type: IV induction Ventilation: Mask ventilation without difficulty LMA: LMA inserted LMA Size: 5.0 Number of attempts: 1 Airway Equipment and Method: bite block Placement Confirmation: positive ETCO2,  CO2 detector and breath sounds checked- equal and bilateral Tube secured with: Tape Dental Injury: Teeth and Oropharynx as per pre-operative assessment

## 2011-07-04 NOTE — Brief Op Note (Signed)
07/04/2011  11:08 AM  PATIENT:  [name]  56 y.o. 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  PROCEDURE:   RIGHT INDEX RADIAL COLLATERAL LIGAMENT RECONSTRUCTION WITH AUTOGENOUS PALMARIS GRAFT  SURGEON: Wyn Forster., MD   PHYSICIAN ASSISTANT:   ASSISTANTS: Mallory Shirk.A-C   ANESTHESIA:   general  EBL:  Total I/O In: 1000 [I.V.:1000] Out: -   BLOOD ADMINISTERED:none  DRAINS: none   LOCAL MEDICATIONS USED:  XYLOCAINE   SPECIMEN:  No Specimen  DISPOSITION OF SPECIMEN:  N/A  COUNTS:  YES  TOURNIQUET:  * Missing tourniquet times found for documented tourniquets in log:  [id number] *  DICTATION: .Other Dictation: Dictation Number [id number]  PLAN OF CARE: Discharge to home after PACU  PATIENT DISPOSITION:  PACU - hemodynamically stable.

## 2011-07-04 NOTE — Anesthesia Preprocedure Evaluation (Signed)
Anesthesia Evaluation  Patient identified by MRN, date of birth, ID band Patient awake    Reviewed: Allergy & Precautions, H&P , NPO status , Patient's Chart, lab work & pertinent test results, reviewed documented beta blocker date and time   Airway Mallampati: II TM Distance: >3 FB Neck ROM: full    Dental   Pulmonary neg pulmonary ROS,          Cardiovascular negative cardio ROS      Neuro/Psych negative neurological ROS  negative psych ROS   GI/Hepatic negative GI ROS, Neg liver ROS,   Endo/Other  negative endocrine ROS  Renal/GU negative Renal ROS  negative genitourinary   Musculoskeletal   Abdominal   Peds  Hematology negative hematology ROS (+)   Anesthesia Other Findings See surgeon's H&P   Reproductive/Obstetrics negative OB ROS                           Anesthesia Physical Anesthesia Plan  ASA: I  Anesthesia Plan: General   Post-op Pain Management:    Induction: Intravenous  Airway Management Planned: LMA  Additional Equipment:   Intra-op Plan:   Post-operative Plan: Extubation in OR  Informed Consent: I have reviewed the patients History and Physical, chart, labs and discussed the procedure including the risks, benefits and alternatives for the proposed anesthesia with the patient or authorized representative who has indicated his/her understanding and acceptance.   Dental Advisory Given  Plan Discussed with: CRNA and Surgeon  Anesthesia Plan Comments:         Anesthesia Quick Evaluation  

## 2011-07-05 NOTE — Op Note (Addendum)
NAMEJARREN, [name]             ACCOUNT NO.:  [id number]  MEDICAL RECORD NO.:  [id number]  LOCATION:                                 FACILITY:  PHYSICIAN:  Juan Fitch. Rome Echavarria, M.D.      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  Identification of mid-substance rupture of radial collateral ligament.  OPERATION:  Reconstruction of right index finger radial collateral ligament utilizing an autogenous palmaris longus tendon graft.  OPERATING SURGEON:  Juan Fitch. Cortney Mckinney, MD.  ASSISTANT:  Annye Rusk, PA-C.  ANESTHESIA:  General by LMA.  SUPERVISING ANESTHESIOLOGIST:  Janetta Hora. Gelene Mink, MD.  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  He was referred through the courtesy of Dr. Theadora Rama for evaluation and management of a painful right index finger MP joint.  Months ago, he sustained a very significant ulnar deviation strain injury to his right index finger metacarpophalangeal joint.  He was initially evaluated by a urgent care facility in [place] and referred for follow up with the plastic surgeon in [place].  The plastic surgeon identified a collateral ligament injury and attempted nonoperative treatment with splinting.  This unfortunately was unsuccessful, therefore [name] was advised to seek a Hand Surgery consult.  He elected to seek treatment in [place].  After discussion with his occupational health physician, Dr. Cleda Clarks, he [facility].  In the office, he was  noted to have a grossly unstable right index finger metacarpophalangeal joint with no end-point to ulnar deviation stress at 80 degrees of flexion.  He did have chronic capsular swelling and scarring.  We define for him the parameters of his injury and were recommended proceeding with either delayed primary repair if it is noted to be avulsed off the proximal phalanx and metacarpal head or a tendon graft if it is noted to be a mid-substance tear.  Preoperatively, questions were invited and answered in detail thoroughly in the office and again in the holding area.  PROCEDURE:  [name] was interviewed by Dr. Gelene Mink and after informed consent, accepted general anesthesia by LMA technique.  He was brought to room 1 of the [facility] and placed in supine position on the operating table.  Ancef 2 g were administered as an IV prophylactic antibiotic per weight protocol.  Under Dr. Thornton Dales direct supervision, general anesthesia by LMA technique was induced followed by routine Betadine scrub and paint of the right upper extremity.  Following routine surgical time-out, the right hand and arm were exsanguinated with a Esmarch bandage and the arterial tourniquet was inflated at 220 mmHg.  Procedure commenced with a dorsal-radial curvilinear incision exposing extensor mechanism.  There was minor injury to the radial sagittal fibers proximally.  The interval between the extensor digitorum longus and extensor indicis proprius was incised and the  capsule identified.  The capsules incised longitudinally and the radial collateral ligament exposed.  There was a mid-substance rupture off at the proximal third of the collateral ligament with a stump remaining on the metacarpal head and some reactive bone spur formation. The distal portion of the collateral ligament had retracted and was quite scarred.  We meticulously dissected the proximal and distal stumps, decided that we  would need to use a tendon graft for reconstruction.  We then turned the hand over and performed a small transverse incision at the distal wrist flexion crease, and after carefully identifying the palmaris longus tendon and the palmar cutaneous branch of the median nerve, we harvested the entire palmaris longus with a brown tendon stripper.  This wound was repaired with intradermal 3-0 Prolene.  A 4-cm segment of the palmaris longus was then meticulously woven through the distal stump of the radial collateral ligament with a curved tendon braider, followed by multiple mattress sutures of 4-0 Mersilene to secure the graft to the distal stump.  We then created a bone trough through the metacarpal head at the anatomic insertion of the radial collateral ligament and used through-bone-suture technique to lock the reconstructed ligament into an anatomic position.  This was reinforced by taking the proximal stump of the ligament and over sewing the tendon graft.  After trimming the tendon ends, the capsule was anatomically reconstructed with interrupted sutures of 4-0 Mersilene, followed by irrigation of wound.  The extensor mechanism was then reconstructed anatomically with figure- of-eight sutures of 4-0 Mersilene, knots buried deep, and the radial sagittal fibers were reefed with mattress suture.  The skin was repaired with intradermal 3-0 Prolene.  A compressive dressing was applied.  The MP joint was immobilized at 40 degrees flexion and radial deviation with slight supination for optimum repair tension.  There were no apparent complications.  For aftercare, [name] is provided prescriptions for Percocet 5 mg 1 p.o. q.4-6 hours p.r.n. pain, 30 tablets without refill; also Keflex 500 mg 1 p.o. q.8 hours for 4 days as a prophylactic antibiotic; and ibuprofen 600 mg 1 p.o. q.6 hours p.r.n.     [name], M.D.     RVS/MEDQ  D:  07/04/2011  T:  07/04/2011  Job:   [id number]  cc:   Theadora Rama, MD

## 2011-08-08 ENCOUNTER — Other Ambulatory Visit: Payer: Self-pay | Admitting: Orthopedic Surgery

## 2011-08-12 ENCOUNTER — Encounter (HOSPITAL_BASED_OUTPATIENT_CLINIC_OR_DEPARTMENT_OTHER): Payer: Self-pay | Admitting: *Deleted

## 2011-08-15 ENCOUNTER — Encounter (HOSPITAL_BASED_OUTPATIENT_CLINIC_OR_DEPARTMENT_OTHER): Payer: Self-pay | Admitting: Anesthesiology

## 2011-08-15 ENCOUNTER — Encounter (HOSPITAL_BASED_OUTPATIENT_CLINIC_OR_DEPARTMENT_OTHER)
Admission: RE | Disposition: A | Discharge status: Home / Self Care | Payer: Self-pay | Source: Ambulatory Visit | Attending: Orthopedic Surgery

## 2011-08-15 ENCOUNTER — Encounter (HOSPITAL_BASED_OUTPATIENT_CLINIC_OR_DEPARTMENT_OTHER): Payer: Self-pay | Admitting: *Deleted

## 2011-08-15 ENCOUNTER — Ambulatory Visit (HOSPITAL_BASED_OUTPATIENT_CLINIC_OR_DEPARTMENT_OTHER): Payer: BC Managed Care – PPO | Admitting: Anesthesiology

## 2011-08-15 ENCOUNTER — Ambulatory Visit (HOSPITAL_BASED_OUTPATIENT_CLINIC_OR_DEPARTMENT_OTHER)
Admission: RE | Admit: 2011-08-15 | Discharge: 2011-08-15 | Disposition: A | Discharge status: Home / Self Care | Payer: BC Managed Care – PPO | Source: Ambulatory Visit | Attending: Orthopedic Surgery | Admitting: Orthopedic Surgery

## 2011-08-15 DIAGNOSIS — Z472 Encounter for removal of internal fixation device: Secondary | ICD-10-CM | POA: Insufficient documentation

## 2011-08-15 HISTORY — PX: HARDWARE REMOVAL: SHX979

## 2011-08-15 SURGERY — REMOVAL, HARDWARE
Anesthesia: Monitor Anesthesia Care | Site: Finger | Laterality: Right | Wound class: Clean

## 2011-08-15 MED ORDER — OXYCODONE HCL 5 MG PO TABS: 5.0000 mg | ORAL_TABLET | Freq: Once | ORAL | Status: AC | PRN

## 2011-08-15 MED ORDER — FENTANYL CITRATE 0.05 MG/ML IJ SOLN
INTRAMUSCULAR | Status: DC | PRN
  Administered 2011-08-15: 100 ug via INTRAVENOUS

## 2011-08-15 MED ORDER — LACTATED RINGERS IV SOLN
INTRAVENOUS | Status: DC
  Administered 2011-08-15: 10:00:00 via INTRAVENOUS

## 2011-08-15 MED ORDER — DOXYCYCLINE HYCLATE 100 MG PO TABS: 100.0000 mg | ORAL_TABLET | Freq: Two times a day (BID) | ORAL | Status: AC

## 2011-08-15 MED ORDER — LIDOCAINE HCL 2 % IJ SOLN
INTRAMUSCULAR | Status: DC | PRN
  Administered 2011-08-15: 2 mL

## 2011-08-15 MED ORDER — FENTANYL CITRATE 0.05 MG/ML IJ SOLN: 25.0000 ug | INTRAMUSCULAR | Status: DC | PRN

## 2011-08-15 MED ORDER — ONDANSETRON HCL 4 MG/2ML IJ SOLN
INTRAMUSCULAR | Status: DC | PRN
  Administered 2011-08-15: 4 mg via INTRAVENOUS

## 2011-08-15 MED ORDER — LIDOCAINE HCL (CARDIAC) 20 MG/ML IV SOLN
INTRAVENOUS | Status: DC | PRN
  Administered 2011-08-15: 50 mg via INTRAVENOUS

## 2011-08-15 MED ORDER — DEXAMETHASONE SODIUM PHOSPHATE 4 MG/ML IJ SOLN
INTRAMUSCULAR | Status: DC | PRN
  Administered 2011-08-15: 10 mg via INTRAVENOUS

## 2011-08-15 MED ORDER — MIDAZOLAM HCL 5 MG/5ML IJ SOLN
INTRAMUSCULAR | Status: DC | PRN
  Administered 2011-08-15: 2 mg via INTRAVENOUS

## 2011-08-15 MED ORDER — METOCLOPRAMIDE HCL 5 MG/ML IJ SOLN: 10.0000 mg | Freq: Once | INTRAMUSCULAR | Status: DC | PRN

## 2011-08-15 MED ORDER — PROPOFOL 10 MG/ML IV BOLUS
INTRAVENOUS | Status: DC | PRN
  Administered 2011-08-15: 40 mg via INTRAVENOUS
  Administered 2011-08-15: 20 mg via INTRAVENOUS

## 2011-08-15 MED ORDER — CHLORHEXIDINE GLUCONATE 4 % EX LIQD: 60.0000 mL | Freq: Once | CUTANEOUS | Status: DC

## 2011-08-15 MED ORDER — OXYCODONE HCL 5 MG PO TABS: 5.0000 mg | ORAL_TABLET | Freq: Once | ORAL | Status: DC | PRN

## 2011-08-15 SURGICAL SUPPLY — 49 items
BANDAGE ADHESIVE 1X3 (GAUZE/BANDAGES/DRESSINGS) IMPLANT
BANDAGE ELASTIC 3 VELCRO ST LF (GAUZE/BANDAGES/DRESSINGS) ×2 IMPLANT
BANDAGE GAUZE ELAST BULKY 4 IN (GAUZE/BANDAGES/DRESSINGS) ×2 IMPLANT
BLADE MINI RND TIP GREEN BEAV (BLADE) IMPLANT
BLADE SURG 15 STRL LF DISP TIS (BLADE) IMPLANT
BLADE SURG 15 STRL SS (BLADE)
BNDG COHESIVE 1X5 TAN STRL LF (GAUZE/BANDAGES/DRESSINGS) IMPLANT
BNDG ESMARK 4X9 LF (GAUZE/BANDAGES/DRESSINGS) IMPLANT
BRUSH SCRUB EZ PLAIN DRY (MISCELLANEOUS) ×2 IMPLANT
CLOTH BEACON ORANGE TIMEOUT ST (SAFETY) ×2 IMPLANT
CORDS BIPOLAR (ELECTRODE) IMPLANT
COVER MAYO STAND STRL (DRAPES) ×2 IMPLANT
COVER TABLE BACK 60X90 (DRAPES) ×2 IMPLANT
CUFF TOURNIQUET SINGLE 18IN (TOURNIQUET CUFF) ×2 IMPLANT
DECANTER SPIKE VIAL GLASS SM (MISCELLANEOUS) IMPLANT
DRAPE EXTREMITY T 121X128X90 (DRAPE) IMPLANT
DRAPE OEC MINIVIEW 54X84 (DRAPES) IMPLANT
DRAPE SURG 17X23 STRL (DRAPES) ×2 IMPLANT
GAUZE XEROFORM 1X8 LF (GAUZE/BANDAGES/DRESSINGS) ×2 IMPLANT
GLOVE BIO SURGEON STRL SZ 6.5 (GLOVE) ×2 IMPLANT
GLOVE BIOGEL M STRL SZ7.5 (GLOVE) ×2 IMPLANT
GLOVE EXAM NITRILE PF MED BLUE (GLOVE) ×2 IMPLANT
GLOVE ORTHO TXT STRL SZ7.5 (GLOVE) ×2 IMPLANT
GOWN BRE IMP PREV XXLGXLNG (GOWN DISPOSABLE) ×4 IMPLANT
GOWN PREVENTION PLUS XLARGE (GOWN DISPOSABLE) ×2 IMPLANT
GOWN PREVENTION PLUS XXLARGE (GOWN DISPOSABLE) IMPLANT
NEEDLE 27GAX1X1/2 (NEEDLE) ×4 IMPLANT
NS IRRIG 1000ML POUR BTL (IV SOLUTION) IMPLANT
PACK BASIN DAY SURGERY FS (CUSTOM PROCEDURE TRAY) IMPLANT
PAD CAST 3X4 CTTN HI CHSV (CAST SUPPLIES) ×1 IMPLANT
PADDING CAST ABS 4INX4YD NS (CAST SUPPLIES)
PADDING CAST ABS COTTON 4X4 ST (CAST SUPPLIES) IMPLANT
PADDING CAST COTTON 3X4 STRL (CAST SUPPLIES) ×1
SPLINT PLASTER CAST XFAST 3X15 (CAST SUPPLIES) ×5 IMPLANT
SPLINT PLASTER XTRA FASTSET 3X (CAST SUPPLIES) ×5
SPONGE GAUZE 4X4 12PLY (GAUZE/BANDAGES/DRESSINGS) IMPLANT
STOCKINETTE 4X48 STRL (DRAPES) IMPLANT
STRIP CLOSURE SKIN 1/2X4 (GAUZE/BANDAGES/DRESSINGS) IMPLANT
SUT CHROMIC 5 0 P 3 (SUTURE) ×2 IMPLANT
SUT PROLENE 3 0 PS 2 (SUTURE) IMPLANT
SUT VIC AB 3-0 PS1 18 (SUTURE)
SUT VIC AB 3-0 PS1 18XBRD (SUTURE) IMPLANT
SYR 3ML 23GX1 SAFETY (SYRINGE) IMPLANT
SYR BULB 3OZ (MISCELLANEOUS) IMPLANT
SYR CONTROL 10ML LL (SYRINGE) ×4 IMPLANT
TOWEL OR 17X24 6PK STRL BLUE (TOWEL DISPOSABLE) ×4 IMPLANT
TRAY DSU PREP LF (CUSTOM PROCEDURE TRAY) ×2 IMPLANT
UNDERPAD 30X30 INCONTINENT (UNDERPADS AND DIAPERS) IMPLANT
WATER STERILE IRR 1000ML POUR (IV SOLUTION) IMPLANT

## 2011-08-15 NOTE — Brief Op Note (Signed)
08/15/2011  12:08 PM  PATIENT:  [name]  56 y.o. male  PRE-OPERATIVE DIAGNOSIS:  retained pins right index finger  POST-OPERATIVE DIAGNOSIS:  retained pins right index finger  PROCEDURE:  HARDWARE REMOVAL (Right) sub tendinous over index metacarpal  SURGEON:  Wyn Forster., MD   PHYSICIAN ASSISTANT:   ASSISTANTS:Jamelia Varano Dasnoit,P.A-C   ANESTHESIA:   IV sedation  EBL:  Total I/O In: 700 [I.V.:700] Out: -   BLOOD ADMINISTERED:none  DRAINS: none   LOCAL MEDICATIONS USED:  LIDOCAINE  Digital block  SPECIMEN:  No Specimen  DISPOSITION OF SPECIMEN:  N/A  COUNTS:  YES  TOURNIQUET:   Total Tourniquet Time Documented: Upper Arm (Right) - 8 minutes  DICTATION: .Other Dictation: Dictation Number [id number]  PLAN OF CARE: Discharge to home after PACU  PATIENT DISPOSITION:  PACU - hemodynamically stable.

## 2011-08-15 NOTE — Discharge Instructions (Signed)

## 2011-08-15 NOTE — H&P (Signed)
  [name] is an 56 y.o. male.   Chief Complaint: patient ready for removal buried K-wire right hand HPI:   [name] is 24 days following his reconstruction of the right index finger metacarpophalangeal joint radial collateral ligament with autogenous tendon graft.  He is developing some rubor over the MP joint. There is no sign of infection.  He is stiff as would be expected with Kirschner wire fixation of his MP joint.  He has full range of motion of his long, ring and small fingers.  We will schedule him for removal of his pin under local anesthesia and sedation on 08/15/11.    Past Medical History  Diagnosis Date  . Hyperlipemia   . No pertinent past medical history     Past Surgical History  Procedure Date  . Septoplasty   . Nasal sinus surgery     x3  . Elbow surgery     right-tennis elbow  . Colonoscopy     History reviewed. No pertinent family history. Social History:  reports that he has been smoking Cigars.  He does not have any smokeless tobacco history on file. He reports that he drinks alcohol. He reports that he does not use illicit drugs.  Allergies: No Known Allergies  No prescriptions prior to admission    No results found for this or any previous visit (from the past 48 hour(s)).  No results found.   Pertinent items are noted in HPI.  There were no vitals taken for this visit.  General appearance: alert Head: Normocephalic, without obvious abnormality Neck: supple, symmetrical, trachea midline Resp: clear to auscultation bilaterally Cardio: regular rate and rhythm GI: normal findings: bowel sounds normal Extremities:  He has full range of motion of his long, ring and small fingers. He has mild rubor around his pin site. He has stiffness of his index finger. There is no sighs of infection. Pulses: 2+ and symmetric Skin: normal Neurologic: Grossly normal    Assessment/Plan Impression: S/P reconstruction right index finger MCP joint RCL with  K-wire fixation  Plan: To be taken to the OR for removal buried K-wire under MAC anesthesia. The procedure, risks and post-op course were discussed with the patient and he was in agreement with the plan.  [name] 08/15/2011, 7:16 AM    H&P documentation: 08/15/2011  -History and Physical Reviewed  -Patient has been re-examined  -No change in the plan of care  [name]

## 2011-08-15 NOTE — Op Note (Signed)
[id number] 

## 2011-08-15 NOTE — Anesthesia Postprocedure Evaluation (Signed)
Anesthesia Post Note  Patient: [name]  Procedure(s) Performed: Procedure(s) (LRB): HARDWARE REMOVAL (Right)  Anesthesia type: General  Patient location: PACU  Post pain: Pain level controlled  Post assessment: Patient's Cardiovascular Status Stable  Last Vitals:  Filed Vitals:   08/15/11 1248  BP: 111/79  Pulse: 47  Temp: 36.6 C  Resp: 16    Post vital signs: Reviewed and stable  Level of consciousness: alert  Complications: No apparent anesthesia complications

## 2011-08-15 NOTE — Transfer of Care (Signed)
Immediate Anesthesia Transfer of Care Note  Patient: [name]  Procedure(s) Performed: Procedure(s) (LRB): HARDWARE REMOVAL (Right)  Patient Location: PACU  Anesthesia Type: MAC  Level of Consciousness: awake, alert  and oriented  Airway & Oxygen Therapy: Patient Spontanous Breathing  Post-op Assessment: Report given to PACU RN and Post -op Vital signs reviewed and stable  Post vital signs: Reviewed and stable  Complications: No apparent anesthesia complications

## 2011-08-15 NOTE — Anesthesia Preprocedure Evaluation (Signed)
Anesthesia Evaluation  Patient identified by MRN, date of birth, ID band Patient awake    Reviewed: Allergy & Precautions, H&P , NPO status , Patient's Chart, lab work & pertinent test results, reviewed documented beta blocker date and time   Airway Mallampati: II TM Distance: >3 FB Neck ROM: full    Dental   Pulmonary neg pulmonary ROS,          Cardiovascular negative cardio ROS      Neuro/Psych negative neurological ROS  negative psych ROS   GI/Hepatic negative GI ROS, Neg liver ROS,   Endo/Other  negative endocrine ROS  Renal/GU negative Renal ROS  negative genitourinary   Musculoskeletal   Abdominal   Peds  Hematology negative hematology ROS (+)   Anesthesia Other Findings See surgeon's H&P   Reproductive/Obstetrics negative OB ROS                           Anesthesia Physical Anesthesia Plan  ASA: I  Anesthesia Plan: MAC   Post-op Pain Management:    Induction: Intravenous  Airway Management Planned: Simple Face Mask  Additional Equipment:   Intra-op Plan:   Post-operative Plan:   Informed Consent: I have reviewed the patients History and Physical, chart, labs and discussed the procedure including the risks, benefits and alternatives for the proposed anesthesia with the patient or authorized representative who has indicated his/her understanding and acceptance.   Dental Advisory Given  Plan Discussed with: CRNA and Surgeon  Anesthesia Plan Comments:         Anesthesia Quick Evaluation  

## 2011-08-16 NOTE — Op Note (Addendum)
[name], [name] NO.:  [id number]  MEDICAL RECORD NO.:  [id number]  LOCATION:                                 FACILITY:  PHYSICIAN:  Katy Fitch. Ade Stmarie, M.D. DATE OF BIRTH:  1955/08/17  DATE OF PROCEDURE:  08/15/2011 DATE OF DISCHARGE:                              OPERATIVE REPORT   PREOPERATIVE DIAGNOSIS:  Status post free tendon graft reconstruction of right index finger metacarpophalangeal joint radial collateral ligament with buried Kirschner wire.  POSTOPERATIVE DIAGNOSIS:  Status post free tendon graft reconstruction of right index finger metacarpophalangeal joint radial collateral ligament with buried Kirschner wire.  OPERATION:  Removal of subtendinous Kirschner wire securing right index finger metacarpophalangeal joint.  OPERATING SURGEON:  Katy Fitch. Evanie Buckle, MD  ASSISTANT:  Marveen Reeks. Dasnoit, PA-C  ANESTHESIA:  IV sedation supplemented by 2% lidocaine metacarpal head level block of right index finger.  SUPERVISING ANESTHESIOLOGIST:  Janetta Hora. Gelene Mink, MD  INDICATIONS:  [name] is a 56 year old gentleman who is referred for evaluation of a complicated chronic radial collateral ligament instability of his right index finger metacarpophalangeal joint.  He was referred late.  Upon examination in the operating room 6 weeks prior, he was noted to have a midsubstance rupture of his radial collateral ligament.  We performed an autogenous palmaris longus tendon graft reconstruction with through bone suture.  [name] has very carefully protected his surgical construct during the past 6 weeks.  We actually noticed today in the preoperative holding area that it was hypersensitive in the index finger.  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  We discussed with him possible manipulation of his PIP and DIP joints under anesthesia.  We will not manipulate the MP joint.  After informed consent, he is brought to the operating room at  this time.  PROCEDURE:  [name] was brought to room #1 of the [facility] and placed in supine position on the operating table.  Dr. Gelene Mink had provided detailed anesthesia and informed consent in the holding area.  In room #1 under Dr. Thornton Dales supervision, IV sedation was provided.  The right hand and arm were then prepped with Betadine soap solution and sterilely draped.  A 2% lidocaine was infiltrated at the metacarpal head level to obtain a digital block.  When anesthesia satisfactory, the arm was elevated followed by exsanguination with gravity.  The arterial tourniquet was inflated to 220 mmHg.  Procedure commenced with a 1 cm incision directly over the radiographically located position of the pin. Subcutaneous tissues were carefully divided taking care to identify the pin by palpation.  The pin was deep to the extensor indicis proprius. The interval between the common extensor to the index finger, the extensor indicis proprius was sharply developed with a scalpel followed by use of a Freer to find the pin.  The pin was rotated and removed without difficulty with the needle driver.  The MP joint was not manipulated.  The PIP joint was gently manipulated. Range of motion 0-60 degrees flexion, DIP joint 0-50 degrees flexion.  The wound was then repaired with intradermal 5-0 chromic followed by dressing with Xeroflo.  The hand was placed  in compressive dressing with an index finger splint for protection.  We will ask [name] to return to our office for followup in 4 days for dressing change, initiation of his therapy program.  He will rest in a radial abduction splint between therapy sessions.     Katy Fitch Taneal Sonntag, M.D.     RVS/MEDQ  D:  08/15/2011  T:  08/16/2011  Job:  [id number]

## 2011-08-19 ENCOUNTER — Encounter (HOSPITAL_BASED_OUTPATIENT_CLINIC_OR_DEPARTMENT_OTHER): Payer: Self-pay | Admitting: Orthopedic Surgery

## 2016-08-22 ENCOUNTER — Other Ambulatory Visit: Payer: Self-pay | Admitting: Orthopedic Surgery

## 2016-08-22 DIAGNOSIS — M25532 Pain in left wrist: Secondary | ICD-10-CM

## 2016-09-05 ENCOUNTER — Ambulatory Visit
Admission: RE | Admit: 2016-09-05 | Discharge: 2016-09-05 | Disposition: A | Discharge status: Home / Self Care | Payer: BLUE CROSS/BLUE SHIELD | Source: Ambulatory Visit | Attending: Orthopedic Surgery | Admitting: Orthopedic Surgery

## 2016-09-05 DIAGNOSIS — M25532 Pain in left wrist: Secondary | ICD-10-CM

## 2016-09-05 MED ORDER — IOPAMIDOL (ISOVUE-M 200) INJECTION 41%
2.0000 mL | Freq: Once | INTRAMUSCULAR | Status: AC
  Administered 2016-09-05: 2 mL via INTRA_ARTICULAR

## 2017-02-18 HISTORY — PX: COLONOSCOPY: SHX174

## 2017-07-10 ENCOUNTER — Encounter: Payer: Self-pay | Admitting: Gastroenterology

## 2017-12-15 ENCOUNTER — Encounter: Payer: Self-pay | Admitting: Gastroenterology

## 2018-01-06 ENCOUNTER — Encounter: Payer: Self-pay | Admitting: Gastroenterology

## 2018-01-06 ENCOUNTER — Ambulatory Visit (AMBULATORY_SURGERY_CENTER): Payer: Self-pay

## 2018-01-06 VITALS — Ht 73.0 in | Wt 212.2 lb

## 2018-01-06 DIAGNOSIS — Z1211 Encounter for screening for malignant neoplasm of colon: Secondary | ICD-10-CM

## 2018-01-06 MED ORDER — PEG 3350-KCL-NA BICARB-NACL 420 G PO SOLR: 4000.0000 mL | Freq: Once | ORAL | 0 refills | Status: AC

## 2018-01-06 NOTE — Progress Notes (Signed)
Per pt, no allergies to soy or egg products.Pt not taking any weight loss meds or using  O2 at home.  Pt refused emmi video. 

## 2018-01-19 ENCOUNTER — Encounter: Payer: Self-pay | Admitting: Gastroenterology

## 2018-01-19 ENCOUNTER — Ambulatory Visit (AMBULATORY_SURGERY_CENTER): Payer: BLUE CROSS/BLUE SHIELD | Admitting: Gastroenterology

## 2018-01-19 ENCOUNTER — Other Ambulatory Visit: Payer: Self-pay

## 2018-01-19 VITALS — BP 127/84 | HR 55 | Temp 97.8°F | Resp 18 | Ht 73.0 in | Wt 212.0 lb

## 2018-01-19 DIAGNOSIS — Z1211 Encounter for screening for malignant neoplasm of colon: Secondary | ICD-10-CM

## 2018-01-19 DIAGNOSIS — K621 Rectal polyp: Secondary | ICD-10-CM

## 2018-01-19 DIAGNOSIS — K5730 Diverticulosis of large intestine without perforation or abscess without bleeding: Secondary | ICD-10-CM

## 2018-01-19 DIAGNOSIS — D128 Benign neoplasm of rectum: Secondary | ICD-10-CM

## 2018-01-19 MED ORDER — SODIUM CHLORIDE 0.9 % IV SOLN: 500.0000 mL | Freq: Once | INTRAVENOUS | Status: DC

## 2018-01-19 NOTE — Patient Instructions (Signed)
YOU HAD AN ENDOSCOPIC PROCEDURE TODAY AT [facility]:   Refer to the procedure report that was given to you for any specific questions about what was found during the examination.  If the procedure report does not answer your questions, please call your gastroenterologist to clarify.  If you requested that your care partner not be given the details of your procedure findings, then the procedure report has been included in a sealed envelope for you to review at your convenience later.  YOU SHOULD EXPECT: Some feelings of bloating in the abdomen. Passage of more gas than usual.  Walking can help get rid of the air that was put into your GI tract during the procedure and reduce the bloating. If you had a lower endoscopy (such as a colonoscopy or flexible sigmoidoscopy) you may notice spotting of blood in your stool or on the toilet paper. If you underwent a bowel prep for your procedure, you may not have a normal bowel movement for a few days.  Please Note:  You might notice some irritation and congestion in your nose or some drainage.  This is from the oxygen used during your procedure.  There is no need for concern and it should clear up in a day or so.  SYMPTOMS TO REPORT IMMEDIATELY:   Following lower endoscopy (colonoscopy or flexible sigmoidoscopy):  Excessive amounts of blood in the stool  Significant tenderness or worsening of abdominal pains  Swelling of the abdomen that is new, acute  Fever of 100F or higher   For urgent or emergent issues, a gastroenterologist can be reached at any hour by calling [phone].  Please see handouts on hemorrhoids and polyps.  DIET:  We do recommend a small meal at first, but then you may proceed to your regular diet.  Drink plenty of fluids but you should avoid alcoholic beverages for 24 hours.  ACTIVITY:  You should plan to take it easy for the rest of today and you should NOT DRIVE or use heavy machinery until tomorrow (because of  the sedation medicines used during the test).    FOLLOW UP: Our staff will call the number listed on your records the next business day following your procedure to check on you and address any questions or concerns that you may have regarding the information given to you following your procedure. If we do not reach you, we will leave a message.  However, if you are feeling well and you are not experiencing any problems, there is no need to return our call.  We will assume that you have returned to your regular daily activities without incident.  If any biopsies were taken you will be contacted by phone or by letter within the next 1-3 weeks.  Please call us at [phone] if you have not heard about the biopsies in 3 weeks.    SIGNATURES/CONFIDENTIALITY: You and/or your care partner have signed paperwork which will be entered into your electronic medical record.  These signatures attest to the fact that that the information above on your After Visit Summary has been reviewed and is understood.  Full responsibility of the confidentiality of this discharge information lies with you and/or your care-partner.   Thank you for allowing [place] to provide your healthcare today.

## 2018-01-19 NOTE — Progress Notes (Signed)
A and O x3. Report to RN. Tolerated MAC anesthesia well.

## 2018-01-19 NOTE — Progress Notes (Signed)
Called to room to assist during endoscopic procedure.  Patient ID and intended procedure confirmed with present staff. Received instructions for my participation in the procedure from the performing physician.  

## 2018-01-19 NOTE — Op Note (Signed)
[facility] Patient Name: [name] Procedure Date: 01/19/2018 2:39 PM MRN: [id number] Endoscopist: Milus Banister , MD Age: 62 Referring MD:  Date of Birth: 1955/09/23 Gender: Male Account #: [id number] Procedure:                Colonoscopy Indications:              Screening for colorectal malignant neoplasm Medicines:                Monitored Anesthesia Care Procedure:                Pre-Anesthesia Assessment:                           - Prior to the procedure, a History and Physical                            was performed, and patient medications and                            allergies were reviewed. The patient's tolerance of                            previous anesthesia was also reviewed. The risks                            and benefits of the procedure and the sedation                            options and risks were discussed with the patient.                            All questions were answered, and informed consent                            was obtained. Prior Anticoagulants: The patient has                            taken no previous anticoagulant or antiplatelet                            agents. ASA Grade Assessment: II - A patient with                            mild systemic disease. After reviewing the risks                            and benefits, the patient was deemed in                            satisfactory condition to undergo the procedure.                           After obtaining informed consent, the colonoscope  was passed under direct vision. Throughout the                            procedure, the patient's blood pressure, pulse, and                            oxygen saturations were monitored continuously. The                            Colonoscope was introduced through the anus and                            advanced to the the cecum, identified by                            appendiceal orifice and  ileocecal valve. The                            colonoscopy was performed without difficulty. The                            patient tolerated the procedure well. The quality                            of the bowel preparation was good. The ileocecal                            valve, appendiceal orifice, and rectum were                            photographed. 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                           A 2 mm polyp was found in the rectum. The polyp was                            sessile. The polyp was removed with a cold snare.                            Resection and retrieval were complete.  The exam was otherwise without abnormality on                            direct and retroflexion views. Complications:            No immediate complications. Estimated blood loss:                            None. Estimated Blood Loss:     Estimated blood loss: none. 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                           - One 2 mm polyp in the rectum, removed with a cold                            snare. Resected and retrieved.                           - The examination was otherwise normal on direct                            and retroflexion views. Recommendation:           - Patient has a contact number available for                            emergencies. The signs and symptoms of potential                             delayed complications were discussed with the                            patient. Return to normal activities tomorrow.                            Written discharge instructions were provided to the                            patient.                           - Resume regular diet.                           - Resume previous medicines.                           You will receive a letter within 2-3 weeks with the  pathology results and my final recommendations.                           If the polyp(s) is proven to be 'pre-cancerous' on                            pathology, you will need repeat colonoscopy in 5                            years. If the polyp(s) is NOT 'precancerous' on                            pathology then you should repeat colon cancer                            screening in 10 years with colonoscopy without need                            for colon cancer screening by any method prior to                            then (including stool testing). Milus Banister, MD 01/19/2018 3:00:44 PM This report has been signed electronically.

## 2018-01-19 NOTE — Progress Notes (Signed)
Pt's states no medical or surgical changes since previsit or office visit. 

## 2018-01-20 ENCOUNTER — Telehealth: Payer: Self-pay | Admitting: *Deleted

## 2018-01-20 NOTE — Telephone Encounter (Signed)
First attempt, left VM.  

## 2018-01-20 NOTE — Telephone Encounter (Signed)
  Follow up Call-  Call back number 01/19/2018  Post procedure Call Back phone  # [phone]  Permission to leave phone message Yes  Some recent data might be hidden     Patient questions:  Do you have a fever, pain , or abdominal swelling? No. Pain Score  0 *  Have you tolerated food without any problems? Yes.    Have you been able to return to your normal activities? Yes.    Do you have any questions about your discharge instructions: Diet   No. Medications  No. Follow up visit  No.  Do you have questions or concerns about your Care? No.  Actions: * If pain score is 4 or above: No action needed, pain <4.

## 2018-01-23 ENCOUNTER — Encounter: Payer: Self-pay | Admitting: Gastroenterology

## 2019-02-08 ENCOUNTER — Other Ambulatory Visit: Payer: Self-pay

## 2019-02-08 ENCOUNTER — Ambulatory Visit: Payer: BLUE CROSS/BLUE SHIELD | Admitting: Plastic Surgery

## 2019-02-08 ENCOUNTER — Encounter: Payer: Self-pay | Admitting: Plastic Surgery

## 2019-02-08 DIAGNOSIS — L989 Disorder of the skin and subcutaneous tissue, unspecified: Secondary | ICD-10-CM

## 2019-02-08 NOTE — Progress Notes (Addendum)
Patient ID: [name], male    DOB: 1955-09-05, 63 y.o.   MRN: [id number]   Chief Complaint  Patient presents with  . Skin Problem    The patient is a 63 year old male here for evaluation of his nose.  The patient states he has had a changing skin lesion on his nose for several years.  He is not particularly bothered by it but his fiance is concerned.  It seems to be getting worse and not any better.  He has not tried any ointments or creams on the area.  It is proximately 4 mm in size.  It is on the left portion of his nose.  It is at the juncture of his dorsum and tip.  It is dry and crusty.  I do not see any other concerning skin lesions.  He has hyperlipidemia.  6 feet 1 inch tall and weighs 192 pounds.  He denies any skin cancer in the past.   Review of Systems  Constitutional: Negative.  Negative for activity change and appetite change.  HENT: Negative.   Eyes: Negative.   Respiratory: Negative for chest tightness.   Cardiovascular: Negative.   Gastrointestinal: Negative.   Genitourinary: Negative.   Musculoskeletal: Negative.   Psychiatric/Behavioral: Negative.     Past Medical History:  Diagnosis Date  . BPH (benign prostatic hyperplasia)   . Hyperlipemia   . No pertinent past medical history     Past Surgical History:  Procedure Laterality Date  . COLONOSCOPY    . ELBOW SURGERY     right-tennis elbow  . FINGER SURGERY  2013   index finger on right hand/ ligament repair  . HARDWARE REMOVAL  08/15/2011   Procedure: HARDWARE REMOVAL;  Surgeon: Cammie Sickle., MD;  Location: [facility];  Service: Orthopedics;  Laterality: Right;  pin removal right index metacarpal phalangeal joint  . NASAL SINUS SURGERY     x3  . SEPTOPLASTY        Current Outpatient Medications:  .  aspirin 81 MG tablet, Take 81 mg by mouth daily., Disp: , Rfl:  .  Melatonin 3 MG TABS, Take 3 mg by mouth at bedtime as needed., Disp: , Rfl:  .  Multiple Vitamin  (MULTIVITAMIN) capsule, Take 1 capsule by mouth daily., Disp: , Rfl:  .  simvastatin (ZOCOR) 40 MG tablet, Take 40 mg by mouth every evening., Disp: , Rfl:  .  tamsulosin (FLOMAX) 0.4 MG CAPS capsule, Take 0.4 mg by mouth daily., Disp: , Rfl:    Objective:   Vitals:   02/08/19 0823  BP: 124/85  Pulse: 68  Temp: 98 F (36.7 C)  SpO2: 97%    Physical Exam Vitals and nursing note reviewed.  Constitutional:      Appearance: Normal appearance.  HENT:     Head: Normocephalic and atraumatic.     Nose:   Cardiovascular:     Rate and Rhythm: Normal rate.  Pulmonary:     Effort: Pulmonary effort is normal.  Abdominal:     General: Abdomen is flat.  Neurological:     General: No focal deficit present.     Mental Status: He is alert and oriented to person, place, and time.  Psychiatric:        Mood and Affect: Mood normal.        Behavior: Behavior normal.        Thought Content: Thought content normal.     Assessment & Plan:  Changing skin lesion  Recommend excision of changing skin lesion.  We talked about biopsy with a shave versus excision.  The patient's preference is for excision.  Request for the procedure has been placed in the computer. Pictures were obtained of the patient and placed in the chart with the patient's or guardian's permission.  The [place] was signed into law in 2016 which includes the topic of electronic health records.  This provides immediate access to information in MyChart.  This includes consultation notes, operative notes, office notes, lab results and pathology reports.  If you have any questions about what you read please let us know at your next visit or call us at the office.  We are right here with you.    [place], DO

## 2019-03-19 ENCOUNTER — Encounter: Payer: Self-pay | Admitting: Plastic Surgery

## 2019-03-19 ENCOUNTER — Other Ambulatory Visit: Payer: Self-pay

## 2019-03-19 ENCOUNTER — Ambulatory Visit (INDEPENDENT_AMBULATORY_CARE_PROVIDER_SITE_OTHER): Payer: BC Managed Care – PPO | Admitting: Plastic Surgery

## 2019-03-19 ENCOUNTER — Other Ambulatory Visit (HOSPITAL_COMMUNITY)
Admission: RE | Admit: 2019-03-19 | Discharge: 2019-03-19 | Disposition: A | Discharge status: Home / Self Care | Payer: BC Managed Care – PPO | Source: Ambulatory Visit | Attending: Plastic Surgery | Admitting: Plastic Surgery

## 2019-03-19 VITALS — BP 126/87 | HR 64 | Temp 98.2°F | Ht 73.0 in | Wt 197.2 lb

## 2019-03-19 DIAGNOSIS — L989 Disorder of the skin and subcutaneous tissue, unspecified: Secondary | ICD-10-CM | POA: Insufficient documentation

## 2019-03-19 NOTE — Progress Notes (Signed)
Procedure Note  Preoperative Dx: Changing skin lesion nose  Postoperative Dx: Same  Procedure: Excision of changing skin lesion nose 5 mm  Anesthesia: Lidocaine 1% with 1:100,000 epinepherine  Indication for Procedure: Changing skin lesion  Description of Procedure: Risks and complications were explained to the patient.  Consent was confirmed and the patient understands the risks and benefits.  The potential complications and alternatives were explained and the patient consents.  The patient expressed understanding the option of not having the procedure and the risks of a scar.  Time out was called and all information was confirmed to be correct.    The area was prepped and drapped.  Lidocaine 1% with epinepherine was injected in the subcutaneous area.  After waiting several minutes for the local to take affect a #15 blade was used to excise the area in an eliptical pattern.  The skin edges were reapproximated with 5-0 Monocryl subcuticular running closure.  A dressing was applied.  The patient was given instructions on how to care for the area and a follow up appointment.  [name] tolerated the procedure well and there were no complications. The specimen was sent to pathology.  The [place] was signed into law in 2016 which includes the topic of electronic health records.  This provides immediate access to information in MyChart.  This includes consultation notes, operative notes, office notes, lab results and pathology reports.  If you have any questions about what you read please let us know at your next visit or call us at the office.  We are right here with you.

## 2019-03-24 LAB — SURGICAL PATHOLOGY

## 2019-03-26 ENCOUNTER — Ambulatory Visit (INDEPENDENT_AMBULATORY_CARE_PROVIDER_SITE_OTHER): Payer: BC Managed Care – PPO | Admitting: Plastic Surgery

## 2019-03-26 ENCOUNTER — Other Ambulatory Visit: Payer: Self-pay

## 2019-03-26 ENCOUNTER — Encounter: Payer: Self-pay | Admitting: Plastic Surgery

## 2019-03-26 VITALS — BP 123/83 | HR 66 | Temp 98.2°F | Ht 73.0 in | Wt 198.4 lb

## 2019-03-26 DIAGNOSIS — L989 Disorder of the skin and subcutaneous tissue, unspecified: Secondary | ICD-10-CM

## 2019-03-26 NOTE — Progress Notes (Signed)
Mr. [name] is doing very well today.  Reports he feels like his nose incision is healing very well.  No complaints.  Denies chest pain, shortness of breath, nausea / vomiting.  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  No carcinoma is identified.  Margins are free.  Sutures removed.  May apply Vaseline to incision area.  May begin using Mederma in 1 week.  Recommend daily use of sunscreen and/or hat to protect from the sun.  Call office with any questions/concerns.  The [place] was signed into law in 2016 which includes the topic of electronic health records.  This provides immediate access to information in MyChart.  This includes consultation notes, operative notes, office notes, lab results and pathology reports.  If you have any questions about what you read please let us know at your next visit or call us at the office.  We are right here with you.

## 2020-08-29 LAB — CBC AND DIFFERENTIAL
HCT: 47 (ref 41–53)
Hemoglobin: 15.9 (ref 13.5–17.5)
Neutrophils Absolute: 2.5
Platelets: 197 (ref 150–399)
WBC: 4.3

## 2020-08-29 LAB — TSH: TSH: 3.23 (ref <=5.90)

## 2020-08-29 LAB — HEPATIC FUNCTION PANEL
ALT: 26 (ref 10–40)
AST: 26 (ref 14–40)
Alkaline Phosphatase: 74 (ref 25–125)
Bilirubin, Total: 0.7

## 2020-08-29 LAB — BASIC METABOLIC PANEL
BUN: 14 (ref 4–21)
Chloride: 101 (ref 99–108)
Creatinine: 0.9 (ref <=1.3)
Glucose: 93
Potassium: 4.7 (ref 3.4–5.3)
Sodium: 139 (ref 137–147)

## 2020-08-29 LAB — COMPREHENSIVE METABOLIC PANEL
Albumin: 4.8 (ref 3.5–5.0)
Calcium: 9.9 (ref 8.7–10.7)
Globulin: 1.8

## 2020-08-29 LAB — LIPID PANEL
Cholesterol: 194 (ref 0–200)
HDL: 80 — AB (ref 35–70)
LDL Cholesterol: 95
LDl/HDL Ratio: 2.4
Triglycerides: 106 (ref 40–160)

## 2020-08-29 LAB — CBC: RBC: 5.17 — AB (ref 3.87–5.11)

## 2020-08-30 ENCOUNTER — Other Ambulatory Visit: Payer: Self-pay | Admitting: Family Medicine

## 2020-08-30 DIAGNOSIS — I2510 Atherosclerotic heart disease of native coronary artery without angina pectoris: Secondary | ICD-10-CM

## 2020-09-08 DIAGNOSIS — Z789 Other specified health status: Secondary | ICD-10-CM | POA: Insufficient documentation

## 2020-09-25 ENCOUNTER — Ambulatory Visit
Admission: RE | Admit: 2020-09-25 | Discharge: 2020-09-25 | Disposition: A | Discharge status: Home / Self Care | Payer: Self-pay | Source: Ambulatory Visit | Attending: Family Medicine | Admitting: Family Medicine

## 2020-09-25 ENCOUNTER — Other Ambulatory Visit: Payer: Self-pay

## 2020-09-25 DIAGNOSIS — I2510 Atherosclerotic heart disease of native coronary artery without angina pectoris: Secondary | ICD-10-CM

## 2020-09-26 ENCOUNTER — Encounter: Payer: Self-pay | Admitting: Family Medicine

## 2020-09-26 ENCOUNTER — Ambulatory Visit: Payer: 59 | Admitting: Family Medicine

## 2020-09-26 DIAGNOSIS — E782 Mixed hyperlipidemia: Secondary | ICD-10-CM

## 2020-09-26 DIAGNOSIS — R3914 Feeling of incomplete bladder emptying: Secondary | ICD-10-CM

## 2020-09-26 DIAGNOSIS — N400 Enlarged prostate without lower urinary tract symptoms: Secondary | ICD-10-CM | POA: Insufficient documentation

## 2020-09-26 DIAGNOSIS — N401 Enlarged prostate with lower urinary tract symptoms: Secondary | ICD-10-CM

## 2020-09-26 DIAGNOSIS — E785 Hyperlipidemia, unspecified: Secondary | ICD-10-CM | POA: Insufficient documentation

## 2020-09-26 NOTE — Progress Notes (Signed)
Provider:  Alain Honey, MD  Careteam: Patient Care Team: Waldemar Dickens, MD as PCP - General (Family Medicine)  PLACE OF SERVICE:  [facility] Directive information    Allergies  Allergen Reactions   Other     Pollen    Chief Complaint  Patient presents with   New Patient (Initial Visit)    Patient presents today for a new patient appointment.     HPI: Patient is a 65 y.o. male new patient.  Was previously seen at [facility] by the staff physician there.  He is retiring soon and will lose the ability to be seen in that clinic.  Generally he is pretty healthy.  He has had some elevation of his PSA and had several different biopsies at [facility].  These biopsies were negative for cancer.  He also has a history of hyperlipidemia and takes simvastatin.  Most recent lipid labs include LDL less than 100. He has gained some weight recently and we talked about importance of carbs and exercise.  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  Yesterday he had a CT cardiac scoring test and brings those results today.  I will have to research his scores to see significance of same. He had an physical exam just several months ago but he is here today more to get established and does not wish to have another exam.  Review of Systems:  Review of Systems  All other systems reviewed and are negative.  Past Medical History:  Diagnosis Date   BPH (benign prostatic hyperplasia)    Hyperlipemia    Neuroma    Left and right   Past Surgical History:  Procedure Laterality Date   COLONOSCOPY  2019   ELBOW SURGERY     right-tennis elbow   FINGER SURGERY  2013   index finger on right hand/ ligament repair   HARDWARE REMOVAL  08/15/2011   Procedure: HARDWARE REMOVAL;  Surgeon: Cammie Sickle., MD;  Location: [facility];  Service: Orthopedics;  Laterality: Right;  pin removal right index metacarpal phalangeal joint   NASAL SINUS SURGERY     x3   PROSTATE  SURGERY     SEPTOPLASTY     Social History:   reports that he has been smoking cigars. He has never used smokeless tobacco. He reports current alcohol use of about 7.0 standard drinks of alcohol per week. He reports that he does not use drugs.  Family History  Problem Relation Age of Onset   Dementia Mother    Hypertension Father    Heart disease Father    Colon cancer Sister    Healthy Son    Healthy Daughter     Medications: Patient's Medications  New Prescriptions   No medications on file  Previous Medications   ASPIRIN 81 MG TABLET    Take 81 mg by mouth daily.   FINASTERIDE (PROSCAR) 5 MG TABLET    Take 5 mg by mouth daily.   MELATONIN 3 MG TABS    Take 3 mg by mouth at bedtime as needed.   MULTIPLE VITAMIN (MULTIVITAMIN) CAPSULE    Take 1 capsule by mouth daily.   SIMVASTATIN (ZOCOR) 40 MG TABLET    Take 40 mg by mouth every evening.   TAMSULOSIN (FLOMAX) 0.4 MG CAPS CAPSULE    Take 0.4 mg by mouth daily.  Modified Medications   No medications on file  Discontinued Medications   No medications on  file    Physical Exam:  There were no vitals filed for this visit. There is no height or weight on file to calculate BMI. Wt Readings from Last 3 Encounters:  03/26/19 198 lb 6.4 oz (90 kg)  03/19/19 197 lb 3.2 oz (89.4 kg)  02/08/19 192 lb 6.4 oz (87.3 kg)    Physical Exam Vitals and nursing note reviewed.  Constitutional:      Appearance: Normal appearance.  Cardiovascular:     Rate and Rhythm: Normal rate and regular rhythm.     Pulses: Normal pulses.     Heart sounds: Normal heart sounds.  Pulmonary:     Effort: Pulmonary effort is normal.     Breath sounds: Normal breath sounds.  Neurological:     General: No focal deficit present.     Mental Status: He is alert and oriented to person, place, and time.  Psychiatric:        Mood and Affect: Mood normal.    Labs reviewed: Basic Metabolic Panel: No results for input(s): NA, K, CL, CO2, GLUCOSE, BUN,  CREATININE, CALCIUM, MG, PHOS, TSH in the last 8760 hours. Liver Function Tests: No results for input(s): AST, ALT, ALKPHOS, BILITOT, PROT, ALBUMIN in the last 8760 hours. No results for input(s): LIPASE, AMYLASE in the last 8760 hours. No results for input(s): AMMONIA in the last 8760 hours. CBC: No results for input(s): WBC, NEUTROABS, HGB, HCT, MCV, PLT in the last 8760 hours. Lipid Panel: No results for input(s): CHOL, HDL, LDLCALC, TRIG, CHOLHDL, LDLDIRECT in the last 8760 hours. TSH: No results for input(s): TSH in the last 8760 hours. A1C: No results found for: HGBA1C   Assessment/Plan  1. Benign prostatic hyperplasia with incomplete bladder emptying PSAs are followed at [facility] here in [place].  He continues with finasteride and tamsulosin.  2. Moderate mixed hyperlipidemia not requiring statin therapy Takes simvastatin 40 mg/day.  LDL is at goal at less than 100.   Alain Honey, MD [facility] [phone]

## 2021-05-24 ENCOUNTER — Ambulatory Visit (INDEPENDENT_AMBULATORY_CARE_PROVIDER_SITE_OTHER): Payer: 59 | Admitting: Orthopedic Surgery

## 2021-05-24 ENCOUNTER — Encounter: Payer: Self-pay | Admitting: Orthopedic Surgery

## 2021-05-24 VITALS — BP 116/74 | HR 84 | Temp 98.4°F | Ht 73.0 in | Wt 200.4 lb

## 2021-05-24 DIAGNOSIS — R062 Wheezing: Secondary | ICD-10-CM

## 2021-05-24 DIAGNOSIS — R051 Acute cough: Secondary | ICD-10-CM

## 2021-05-24 MED ORDER — ALBUTEROL SULFATE HFA 108 (90 BASE) MCG/ACT IN AERS: 2.0000 | INHALATION_SPRAY | Freq: Four times a day (QID) | RESPIRATORY_TRACT | 0 refills | Status: DC | PRN

## 2021-05-24 MED ORDER — BENZONATATE 100 MG PO CAPS
200.0000 mg | ORAL_CAPSULE | Freq: Two times a day (BID) | ORAL | 0 refills | Status: DC | PRN
Start: 2021-05-24 — End: 2021-09-25

## 2021-05-24 MED ORDER — PREDNISONE 10 MG PO TABS: ORAL_TABLET | ORAL | 0 refills | Status: AC

## 2021-05-24 NOTE — Progress Notes (Signed)
? ? ?Careteam: ?Patient Care Team: ?Wardell Honour, MD as PCP - General (Family Medicine) ? ?Seen by: Windell Moulding, AGNP-C ? ?PLACE OF SERVICE:  ?[facility]  ?Advanced Directive information ?Does Patient Have a Medical Advance Directive?: No;Yes, Type of Advance Directive: Healthcare Power of [place];Living will ? ?Allergies  ?Allergen Reactions  ? Other   ?  Pollen  ? ? ?Chief Complaint  ?Patient presents with  ? Acute Visit  ?  Patient is present for coughing and wheezing. Patient stated that he is taking OTC medications and the worst of it was yesterday 4/5  ? ? ? ?HPI: Patient is a 66 y.o. male seen today for acute visit for coughing and wheezing.  ? ?Onset of dry cough and wheezing began 5 days ago. Home covid test negative. Denies fever, chest pain, sob, malaise,and body aches. Admits to a few days of sore throat, but resolved at this time. Nonproductive cough occurring throughout the day. In addition, he has been wheezing more after coughing. Taking Flonase and Nightquil without success. Family members have had similar cold recently.  ? ?Review of Systems:  ?Review of Systems  ?Constitutional:  Negative for chills, fever, malaise/fatigue and weight loss.  ?HENT:  Positive for congestion and sore throat.   ?Eyes:  Negative for blurred vision, double vision and discharge.  ?Respiratory:  Positive for cough and wheezing. Negative for sputum production and shortness of breath.   ?Cardiovascular:  Negative for chest pain and leg swelling.  ?Gastrointestinal:  Negative for diarrhea and nausea.  ?Musculoskeletal:  Negative for myalgias.  ?Neurological:  Negative for dizziness and headaches.  ?Psychiatric/Behavioral:  Negative for depression. The patient is not nervous/anxious.   ? ?Past Medical History:  ?Diagnosis Date  ? BPH (benign prostatic hyperplasia)   ? Hyperlipemia   ? 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 ?Past Surgical History:  ?Procedure Laterality Date  ? COLONOSCOPY  2019  ? ELBOW SURGERY    ? right-tennis  elbow  ? FINGER SURGERY  2013  ? index finger on right hand/ ligament repair  ? HARDWARE REMOVAL  08/15/2011  ? Procedure: HARDWARE REMOVAL;  Surgeon: Cammie Sickle., MD;  Location: [place];  Service: Orthopedics;  Laterality: Right;  pin removal right index metacarpal phalangeal joint  ? NASAL SINUS SURGERY    ? x3  ? PROSTATE SURGERY    ? SEPTOPLASTY    ? ?Social History: ?  reports that he quit smoking about 11 years ago. His smoking use included cigars. He has never used smokeless tobacco. He reports current alcohol use of about 7.0 standard drinks per week. He reports that he does not use drugs. ? ?Family History  ?Problem Relation Age of Onset  ? Dementia Mother   ? Hypertension Father   ? Heart disease Father   ? Colon cancer Sister   ? Healthy Son   ? Healthy Daughter   ? ? ?Medications: ?Patient's Medications  ?New Prescriptions  ? No medications on file  ?Previous Medications  ? FINASTERIDE (PROSCAR) 5 MG TABLET    Take 5 mg by mouth daily.  ? MELATONIN 3 MG TABS    Take 3 mg by mouth at bedtime as needed.  ? MULTIPLE VITAMIN (MULTIVITAMIN) CAPSULE    Take 1 capsule by mouth daily.  ? SIMVASTATIN (ZOCOR) 40 MG TABLET    Take 40 mg by mouth every evening.  ? TAMSULOSIN (FLOMAX) 0.4 MG CAPS CAPSULE    Take 0.4  mg by mouth daily.  ?Modified Medications  ? No medications on file  ?Discontinued Medications  ? ASPIRIN 81 MG TABLET    Take 81 mg by mouth daily.  ? ? ?Physical Exam: ? ?Vitals:  ? 05/24/21 1430  ?BP: 116/74  ?Pulse: 84  ?Temp: 98.4 ?F (36.9 ?C)  ?TempSrc: Temporal  ?SpO2: 96%  ?Weight: 200 lb 6.4 oz (90.9 kg)  ?Height: '6\' 1"'$  (1.854 m)  ? ?Body mass index is 26.44 kg/m?. ?Wt Readings from Last 3 Encounters:  ?05/24/21 200 lb 6.4 oz (90.9 kg)  ?09/26/20 201 lb 6.4 oz (91.4 kg)  ?03/26/19 198 lb 6.4 oz (90 kg)  ? ? ?Physical Exam ?Vitals reviewed.  ?Constitutional:   ?   General: He is not in acute distress. ?HENT:  ?   Head: Normocephalic.  ?   Right Ear: Tympanic membrane  normal. There is no impacted cerumen.  ?   Left Ear: Tympanic membrane normal. There is no impacted cerumen.  ?   Nose: Nose normal. No congestion or rhinorrhea.  ?   Mouth/Throat:  ?   Mouth: Mucous membranes are moist.  ?   Pharynx: No oropharyngeal exudate or posterior oropharyngeal erythema.  ?Eyes:  ?   General:     ?   Right eye: No discharge.     ?   Left eye: No discharge.  ?Cardiovascular:  ?   Rate and Rhythm: Normal rate and regular rhythm.  ?   Pulses: Normal pulses.  ?   Heart sounds: Normal heart sounds.  ?Pulmonary:  ?   Effort: Pulmonary effort is normal. No respiratory distress.  ?   Breath sounds: Examination of the right-upper field reveals decreased breath sounds. Examination of the left-upper field reveals decreased breath sounds. Decreased breath sounds and wheezing present. No rales.  ?   Comments: Expiratory wheezing, RR 18 ?Abdominal:  ?   General: Bowel sounds are normal. There is no distension.  ?   Palpations: Abdomen is soft.  ?   Tenderness: There is no abdominal tenderness.  ?Musculoskeletal:  ?   Cervical back: Normal range of motion.  ?   Right lower leg: No edema.  ?   Left lower leg: No edema.  ?Lymphadenopathy:  ?   Cervical: No cervical adenopathy.  ?Skin: ?   General: Skin is warm and dry.  ?   Capillary Refill: Capillary refill takes less than 2 seconds.  ?Neurological:  ?   General: No focal deficit present.  ?   Mental Status: He is alert and oriented to person, place, and time.  ?Psychiatric:     ?   Mood and Affect: Mood normal.     ?   Behavior: Behavior normal.  ? ? ?Labs reviewed: ?Basic Metabolic Panel: ?Recent Labs  ?  08/29/20 ?0000  ?NA 139  ?K 4.7  ?CL 101  ?BUN 14  ?CREATININE 0.9  ?CALCIUM 9.9  ?TSH 3.23  ? ?Liver Function Tests: ?Recent Labs  ?  08/29/20 ?0000  ?AST 26  ?ALT 26  ?ALKPHOS 74  ?ALBUMIN 4.8  ? ?No results for input(s): LIPASE, AMYLASE in the last 8760 hours. ?No results for input(s): AMMONIA in the last 8760 hours. ?CBC: ?Recent Labs  ?   08/29/20 ?0000  ?WBC 4.3  ?NEUTROABS 2.50  ?HGB 15.9  ?HCT 47  ?PLT 197  ? ?Lipid Panel: ?Recent Labs  ?  08/29/20 ?0000  ?CHOL 194  ?HDL 80*  ?[place] 95  ?TRIG 106  ? ?TSH: ?  Recent Labs  ?  08/29/20 ?0000  ?TSH 3.23  ? ?A1C: ?No results found for: HGBA1C ? ? ?Assessment/Plan ?1. Wheezing ?- expiratory wheezing on auscultation ?- RR and O2 sat normal ?- bronchitis vs allergies?  ?- predniSONE (DELTASONE) 10 MG tablet; Take 4 tablets (40 mg total) by mouth daily with breakfast for 1 day, THEN 3 tablets (30 mg total) daily with breakfast for 1 day, THEN 2 tablets (20 mg total) daily with breakfast for 1 day, THEN 1 tablet (10 mg total) daily with breakfast for 1 day, THEN 0.5 tablets (5 mg total) daily with breakfast for 1 day.  Dispense: 10.5 tablet; Refill: 0 ?- albuterol (VENTOLIN HFA) 108 (90 Base) MCG/ACT inhaler; Inhale 2 puffs into the lungs every 6 (six) hours as needed for wheezing or shortness of breath.  Dispense: 8 g; Refill: 0 ?- discussed trial of OTC allergy medication like Claritin or Zyrtec ? ?2. Acute cough ?- see above ?- predniSONE (DELTASONE) 10 MG tablet; Take 4 tablets (40 mg total) by mouth daily with breakfast for 1 day, THEN 3 tablets (30 mg total) daily with breakfast for 1 day, THEN 2 tablets (20 mg total) daily with breakfast for 1 day, THEN 1 tablet (10 mg total) daily with breakfast for 1 day, THEN 0.5 tablets (5 mg total) daily with breakfast for 1 day.  Dispense: 10.5 tablet; Refill: 0 ?- benzonatate (TESSALON) 100 MG capsule; Take 2 capsules (200 mg total) by mouth 2 (two) times daily as needed for cough.  Dispense: 20 capsule; Refill: 0 ?- albuterol (VENTOLIN HFA) 108 (90 Base) MCG/ACT inhaler; Inhale 2 puffs into the lungs every 6 (six) hours as needed for wheezing or shortness of breath.  Dispense: 8 g; Refill: 0 ? ?Total time: 23. Greater than 50% of total time spent doing patient education regarding wheezing and acute cough including symptom and medication management.  ? ?Next  appt: Visit date not found  ?[place], AGNP ? ?[facility] ?[phone]  ?

## 2021-05-24 NOTE — Patient Instructions (Signed)
Please finish prednisone- always take in the morning ? ?May take albuterol inhaler for wheezing and sob ? ? ?

## 2021-09-18 DIAGNOSIS — M12842 Other specific arthropathies, not elsewhere classified, left hand: Secondary | ICD-10-CM | POA: Diagnosis not present

## 2021-09-18 DIAGNOSIS — M79642 Pain in left hand: Secondary | ICD-10-CM | POA: Diagnosis not present

## 2021-09-25 ENCOUNTER — Ambulatory Visit (INDEPENDENT_AMBULATORY_CARE_PROVIDER_SITE_OTHER): Payer: Medicare Other | Admitting: Family Medicine

## 2021-09-25 ENCOUNTER — Encounter: Payer: Self-pay | Admitting: Family Medicine

## 2021-09-25 VITALS — BP 148/96 | HR 74 | Temp 96.9°F | Resp 20 | Ht 73.0 in | Wt 197.0 lb

## 2021-09-25 DIAGNOSIS — E782 Mixed hyperlipidemia: Secondary | ICD-10-CM | POA: Diagnosis not present

## 2021-09-25 DIAGNOSIS — E785 Hyperlipidemia, unspecified: Secondary | ICD-10-CM | POA: Insufficient documentation

## 2021-09-25 DIAGNOSIS — R739 Hyperglycemia, unspecified: Secondary | ICD-10-CM | POA: Diagnosis not present

## 2021-09-25 DIAGNOSIS — R350 Frequency of micturition: Secondary | ICD-10-CM | POA: Diagnosis not present

## 2021-09-25 DIAGNOSIS — R051 Acute cough: Secondary | ICD-10-CM | POA: Diagnosis not present

## 2021-09-25 DIAGNOSIS — Z131 Encounter for screening for diabetes mellitus: Secondary | ICD-10-CM | POA: Diagnosis not present

## 2021-09-25 DIAGNOSIS — R3914 Feeling of incomplete bladder emptying: Secondary | ICD-10-CM | POA: Diagnosis not present

## 2021-09-25 DIAGNOSIS — N401 Enlarged prostate with lower urinary tract symptoms: Secondary | ICD-10-CM

## 2021-09-25 DIAGNOSIS — R059 Cough, unspecified: Secondary | ICD-10-CM | POA: Insufficient documentation

## 2021-09-25 NOTE — Progress Notes (Signed)
ipid   Provider:  Alain Honey, MD  Careteam: Patient Care Team: Wardell Honour, MD as PCP - General (Family Medicine)  PLACE OF SERVICE:  [place] Directive information    Allergies  Allergen Reactions   Other     Pollen    No chief complaint on file.    HPI: Patient is a 66 y.o. male patient is here for annual exam.  Problems include hyperlipidemia, elevated PSA and prostatic hypertrophy.  He does have some cough symptoms as well as right hip pain that is worse after inactivity and relieved by movement. Brought blood pressure has been elevated intermittently and this morning it is elevated.  I have asked him to monitor blood pressure with a goal of 135/85 or below.  Review of Systems:  Review of Systems  Constitutional: Negative.   Eyes: Negative.   Respiratory: Negative.    Cardiovascular: Negative.   Musculoskeletal:  Positive for joint pain.  Neurological: Negative.   Psychiatric/Behavioral: Negative.    All other systems reviewed and are negative.   Past Medical History:  Diagnosis Date   BPH (benign prostatic hyperplasia)    Hyperlipemia    Neuroma    Left and right   Past Surgical History:  Procedure Laterality Date   COLONOSCOPY  2019   ELBOW SURGERY     right-tennis elbow   FINGER SURGERY  2013   index finger on right hand/ ligament repair   HARDWARE REMOVAL  08/15/2011   Procedure: HARDWARE REMOVAL;  Surgeon: Cammie Sickle., MD;  Location: [facility];  Service: Orthopedics;  Laterality: Right;  pin removal right index metacarpal phalangeal joint   NASAL SINUS SURGERY     x3   PROSTATE SURGERY     SEPTOPLASTY     Social History:   reports that he quit smoking about 11 years ago. His smoking use included cigars. He has never used smokeless tobacco. He reports current alcohol use of about 7.0 standard drinks of alcohol per week. He reports that he does not use drugs.  Family History  Problem Relation Age of  Onset   Dementia Mother    Hypertension Father    Heart disease Father    Colon cancer Sister    Healthy Son    Healthy Daughter     Medications: Patient's Medications  New Prescriptions   No medications on file  Previous Medications   ALBUTEROL (VENTOLIN HFA) 108 (90 BASE) MCG/ACT INHALER    Inhale 2 puffs into the lungs every 6 (six) hours as needed for wheezing or shortness of breath.   BENZONATATE (TESSALON) 100 MG CAPSULE    Take 2 capsules (200 mg total) by mouth 2 (two) times daily as needed for cough.   FINASTERIDE (PROSCAR) 5 MG TABLET    Take 5 mg by mouth daily.   MELATONIN 3 MG TABS    Take 3 mg by mouth at bedtime as needed.   MULTIPLE VITAMIN (MULTIVITAMIN) CAPSULE    Take 1 capsule by mouth daily.   SIMVASTATIN (ZOCOR) 40 MG TABLET    Take 40 mg by mouth every evening.   TAMSULOSIN (FLOMAX) 0.4 MG CAPS CAPSULE    Take 0.4 mg by mouth daily.  Modified Medications   No medications on file  Discontinued Medications   No medications on file    Physical Exam:  There were no vitals filed for this visit. There is no height or weight on file to calculate BMI. Wt  Readings from Last 3 Encounters:  05/24/21 200 lb 6.4 oz (90.9 kg)  09/26/20 201 lb 6.4 oz (91.4 kg)  03/26/19 198 lb 6.4 oz (90 kg)    Physical Exam Vitals reviewed.  Constitutional:      Appearance: Normal appearance.  HENT:     Right Ear: Tympanic membrane normal.     Left Ear: Tympanic membrane normal.  Cardiovascular:     Rate and Rhythm: Normal rate and regular rhythm.     Pulses: Normal pulses.     Heart sounds: Normal heart sounds.  Pulmonary:     Effort: Pulmonary effort is normal.     Breath sounds: Normal breath sounds.  Musculoskeletal:        General: Normal range of motion.  Neurological:     General: No focal deficit present.     Mental Status: He is alert and oriented to person, place, and time.  Psychiatric:        Mood and Affect: Mood normal.        Behavior: Behavior normal.      Labs reviewed: Basic Metabolic Panel: No results for input(s): "NA", "K", "CL", "CO2", "GLUCOSE", "BUN", "CREATININE", "CALCIUM", "MG", "PHOS", "TSH" in the last 8760 hours. Liver Function Tests: No results for input(s): "AST", "ALT", "ALKPHOS", "BILITOT", "PROT", "ALBUMIN" in the last 8760 hours. No results for input(s): "LIPASE", "AMYLASE" in the last 8760 hours. No results for input(s): "AMMONIA" in the last 8760 hours. CBC: No results for input(s): "WBC", "NEUTROABS", "HGB", "HCT", "MCV", "PLT" in the last 8760 hours. Lipid Panel: No results for input(s): "CHOL", "HDL", "LDLCALC", "TRIG", "CHOLHDL", "LDLDIRECT" in the last 8760 hours. TSH: No results for input(s): "TSH" in the last 8760 hours. A1C: No results found for: "HGBA1C"   Assessment/Plan    2. Frequency of urination Followed by urology.  Continues with finasteride and Flomax.  He does have urinary frequency in the daytime as well as nighttime  3. Benign prostatic hyperplasia with incomplete bladder emptying See above  4. Acute cough Probably upper respiratory infection I have recommended Mucinex rather than antihistamine  5. Hyperlipidemia, unspecified hyperlipidemia type Patient taking simvastatin.  Last LDL was 95    Alain Honey, MD [place] [phone]

## 2021-09-25 NOTE — Patient Instructions (Signed)
You may try glucosamine for hip and joint pains, twice a day with meals Also, Mucinex DM for cough.  May also try hard candy.  Also albuterol inhaler as needed

## 2021-09-26 LAB — COMPLETE METABOLIC PANEL WITH GFR
AG Ratio: 2.3 (calc) (ref 1.0–2.5)
ALT: 26 U/L (ref 9–46)
AST: 19 U/L (ref 10–35)
Albumin: 4.6 g/dL (ref 3.6–5.1)
Alkaline phosphatase (APISO): 69 U/L (ref 35–144)
BUN: 14 mg/dL (ref 7–25)
CO2: 26 mmol/L (ref 20–32)
Calcium: 9.5 mg/dL (ref 8.6–10.3)
Chloride: 101 mmol/L (ref 98–110)
Creat: 0.94 mg/dL (ref 0.70–1.35)
Globulin: 2 g/dL (calc) (ref 1.9–3.7)
Glucose, Bld: 94 mg/dL (ref 65–99)
Potassium: 4.2 mmol/L (ref 3.5–5.3)
Sodium: 138 mmol/L (ref 135–146)
Total Bilirubin: 0.9 mg/dL (ref 0.2–1.2)
Total Protein: 6.6 g/dL (ref 6.1–8.1)
eGFR: 90 mL/min/{1.73_m2} (ref >=60)

## 2021-09-26 LAB — LIPID PANEL
Cholesterol: 195 mg/dL (ref <200)
HDL: 72 mg/dL (ref >=40)
LDL Cholesterol (Calc): 98 mg/dL (calc)
Non-HDL Cholesterol (Calc): 123 mg/dL (calc) (ref <130)
Total CHOL/HDL Ratio: 2.7 (calc) (ref <5.0)
Triglycerides: 146 mg/dL (ref <150)

## 2021-10-07 ENCOUNTER — Other Ambulatory Visit: Payer: Self-pay | Admitting: Orthopedic Surgery

## 2021-10-07 DIAGNOSIS — R051 Acute cough: Secondary | ICD-10-CM

## 2021-10-07 DIAGNOSIS — R062 Wheezing: Secondary | ICD-10-CM

## 2021-10-16 DIAGNOSIS — M79642 Pain in left hand: Secondary | ICD-10-CM | POA: Diagnosis not present

## 2021-10-16 DIAGNOSIS — M19032 Primary osteoarthritis, left wrist: Secondary | ICD-10-CM | POA: Diagnosis not present

## 2021-11-07 ENCOUNTER — Other Ambulatory Visit: Payer: Self-pay

## 2021-11-07 MED ORDER — SIMVASTATIN 40 MG PO TABS: 40.0000 mg | ORAL_TABLET | Freq: Every evening | ORAL | 3 refills | Status: AC

## 2022-01-12 IMAGING — CT CT CARDIAC CORONARY ARTERY CALCIUM SCORE
3 series · 14 of 20 positions shown, 16 images · non-contrast
Comparison: None

EXAM:
CT CARDIAC CORONARY ARTERY CALCIUM SCORE
TECHNIQUE: Non-contrast imaging through the heart was performed using
prospective ECG gating. Image post processing was performed on an
independent workstation, allowing for quantitative analysis of the
heart and coronary arteries. Note that this exam targets the heart
and the chest was not imaged in its entirety.

[Series 2: calcium scoring 2.00 qr36 bestdiast 72% hrt calciu · axial · 0.34mm/px · z∈[+1622,+1706]mm · 4 of 72 slices shown]
[im 15/72  vessel]
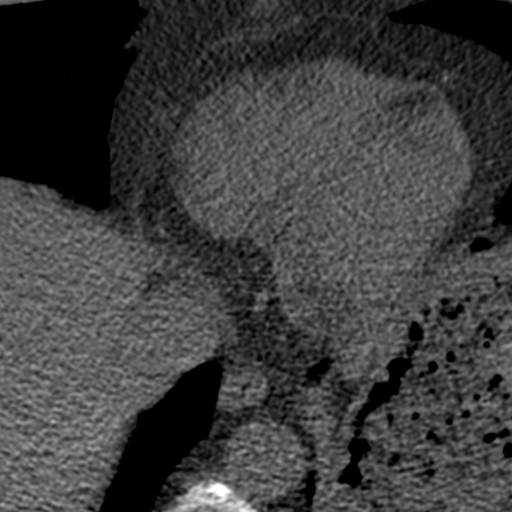
[im 29/72  vessel]
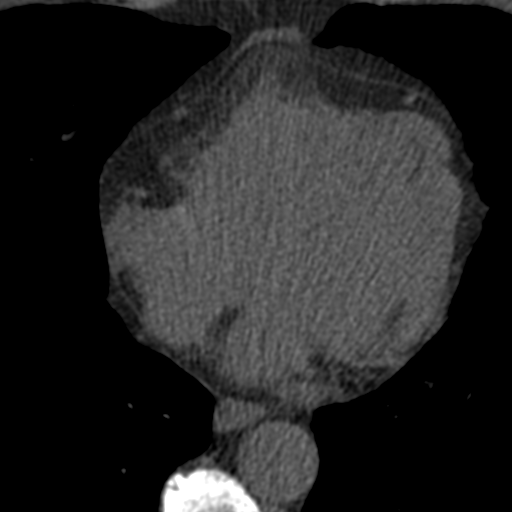
[im 43/72  vessel]
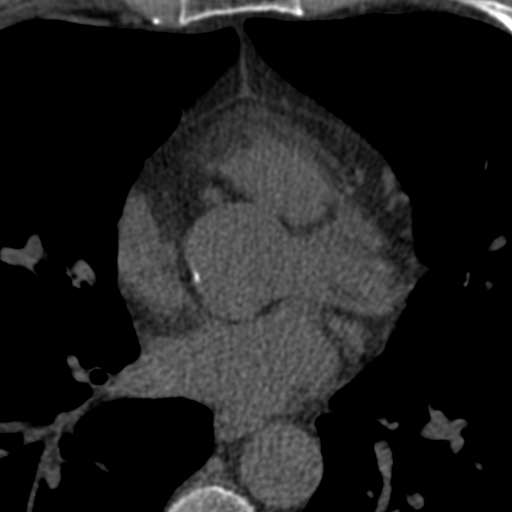
[im 57/72  vessel]
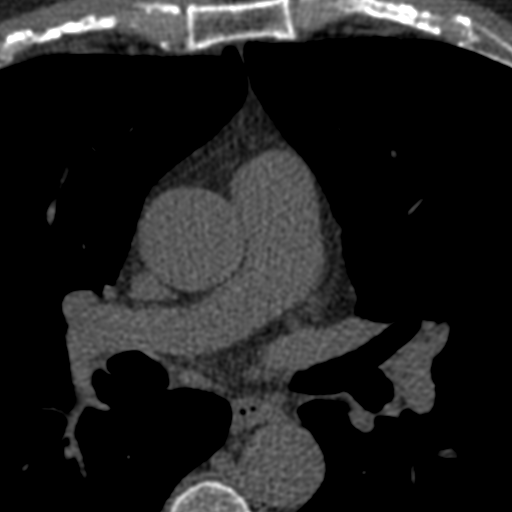

[Series 3: calcium scoring 2.00 br40 bestdiast 72% axial · axial · 0.53mm/px · z∈[+1616,+1712]mm · 5 of 72 slices shown, 7 images]
[im 12/72  vessel]
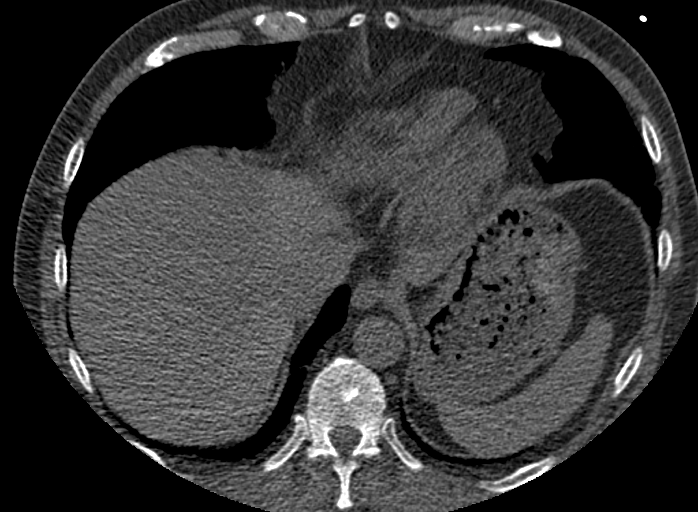
[im 12/72  lung]
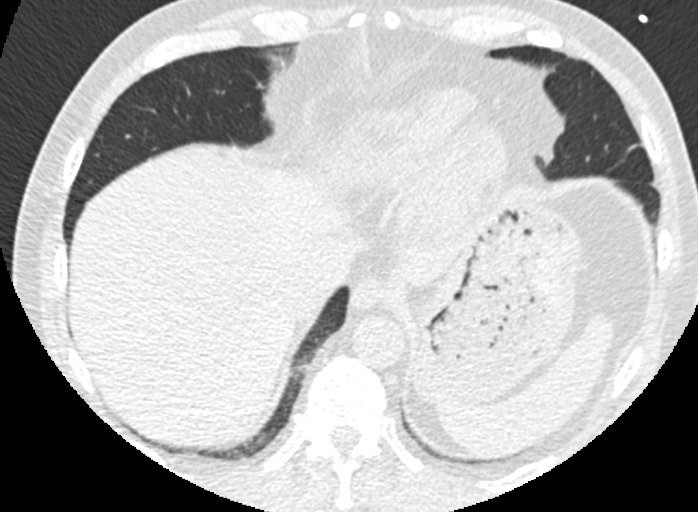
[im 24/72  vessel]
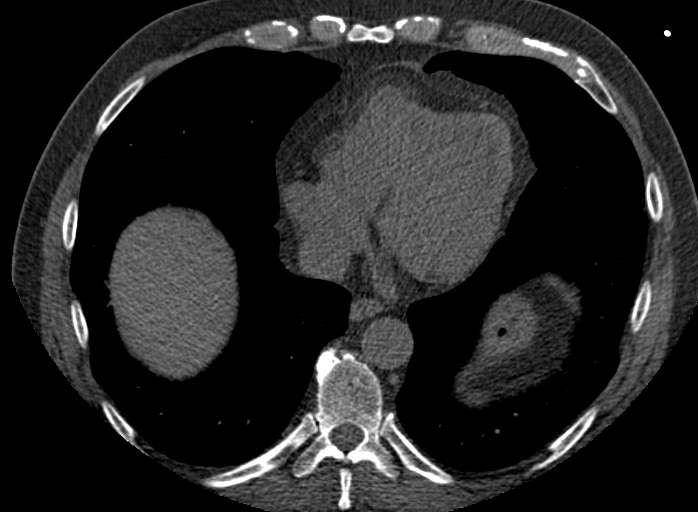
[im 36/72  vessel]
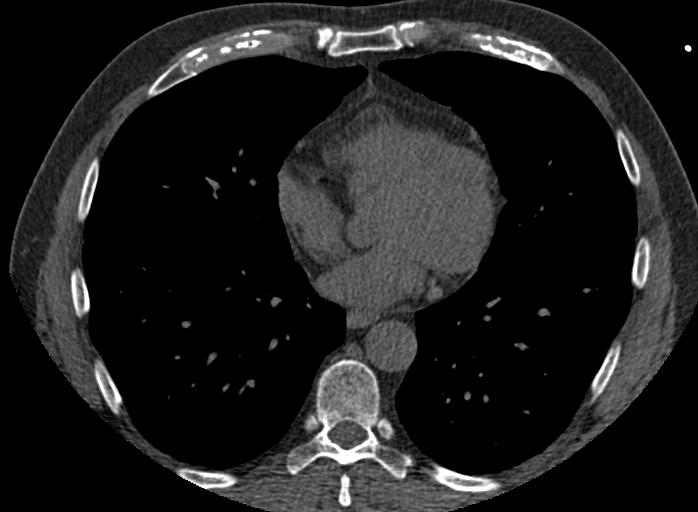
[im 48/72  vessel]
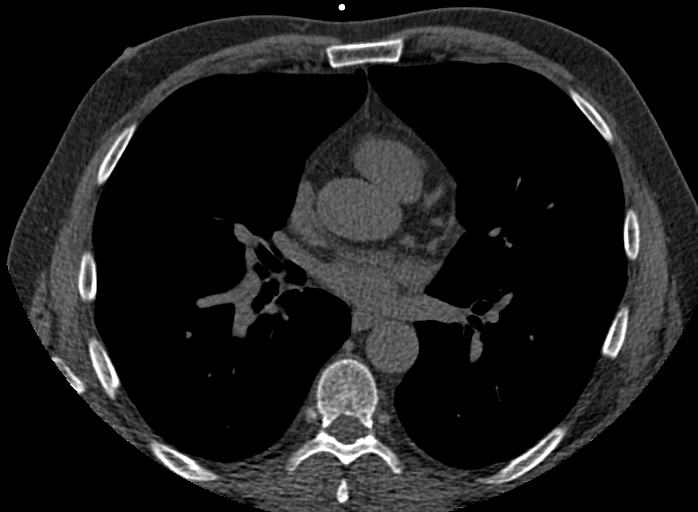
[im 60/72  vessel]
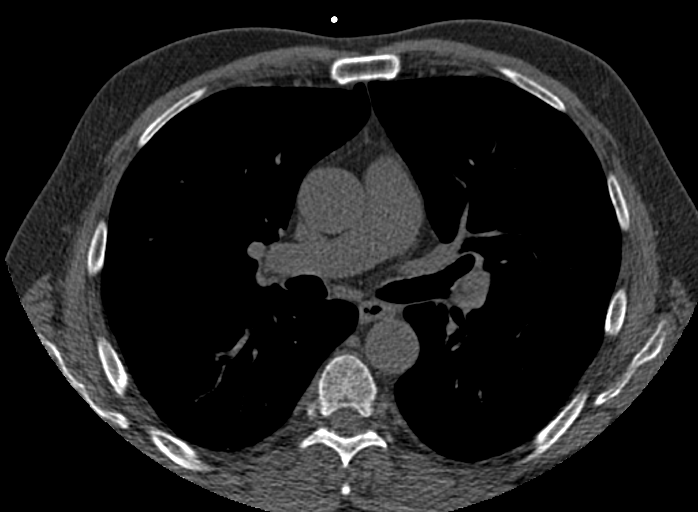
[im 60/72  lung]
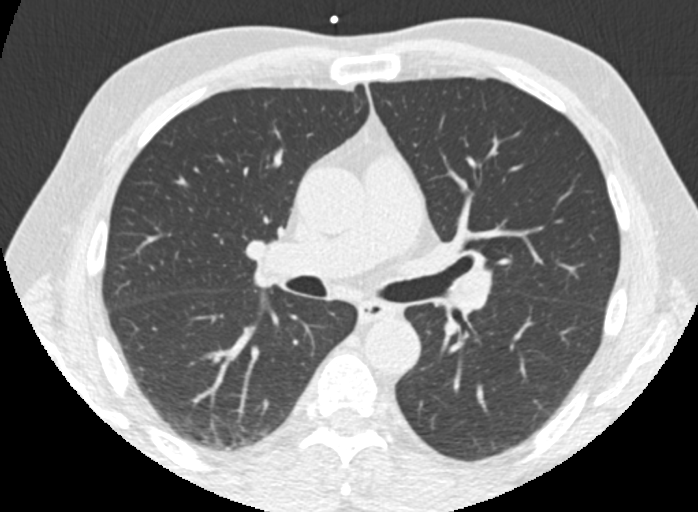

[Series 9: calcium scoring 2.00 br60 bestdiast 72% lungs · axial · 0.53mm/px · z∈[+1616,+1712]mm · 5 of 72 slices shown]
[im 12/72  vessel]
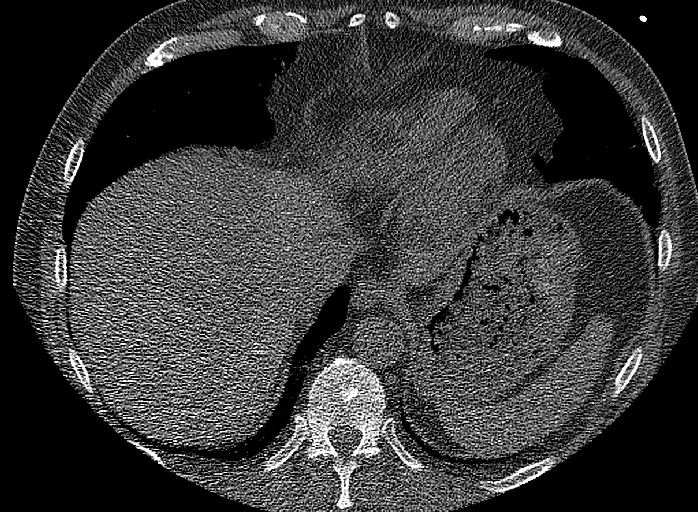
[im 24/72  vessel]
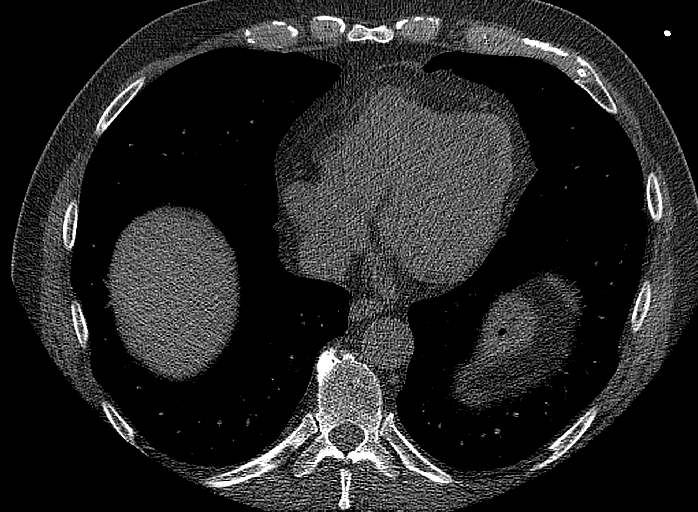
[im 36/72  vessel]
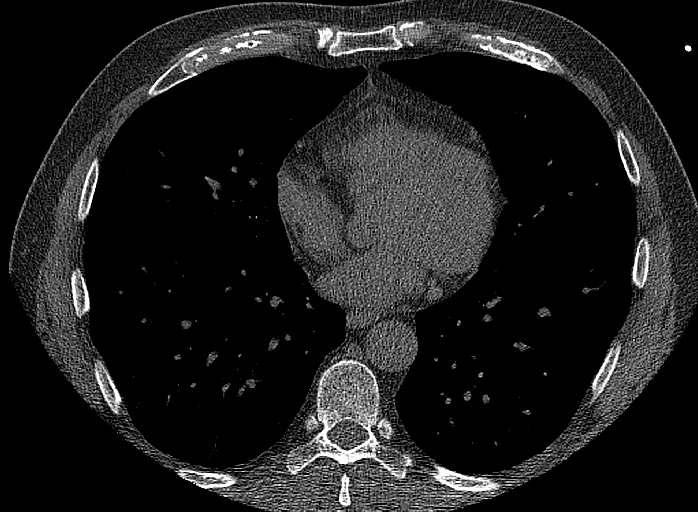
[im 48/72  vessel]
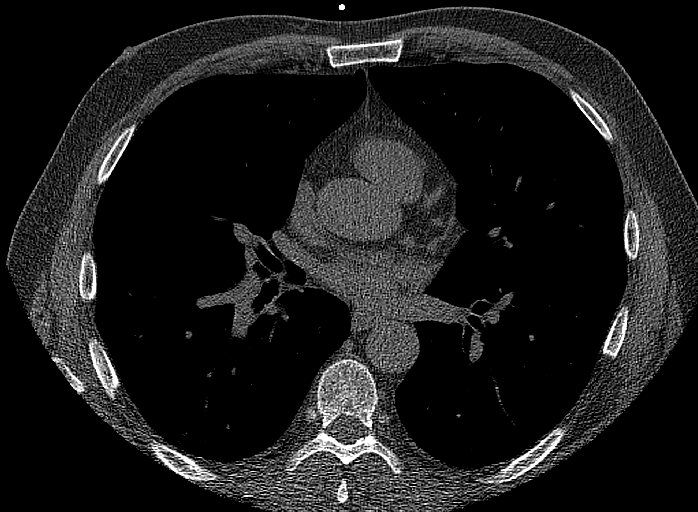
[im 60/72  vessel]
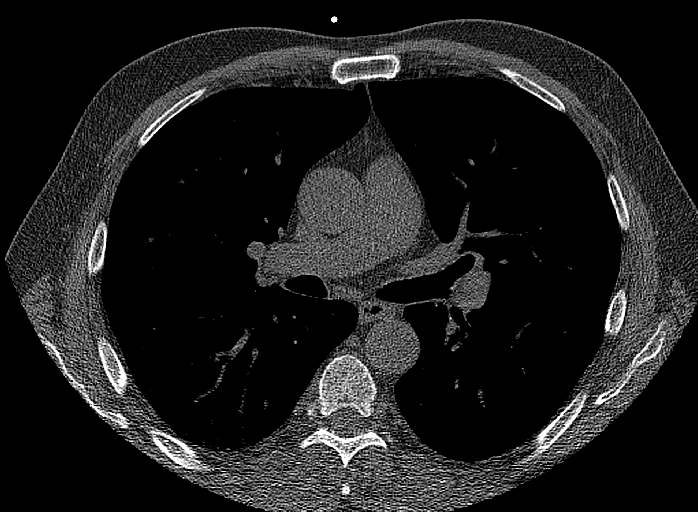

[14 of 20 positions shown; findings below may reference images not displayed]

FINDINGS: CORONARY CALCIUM SCORES:

Left Main: 16

LAD: 0

LCx: 0

RCA: 0

Total Agatston Score: 16

[HOSPITAL] percentile: 34

AORTA MEASUREMENTS:

Ascending Aorta: 36 mm

Descending Aorta: 27 mm

OTHER FINDINGS:

Heart is normal size. Aorta normal caliber. Scattered calcifications
in the aortic root. No adenopathy. No confluent airspace opacities
or effusions. Imaging into the upper abdomen demonstrates no acute
findings. Chest wall soft tissues are unremarkable. No acute bony
abnormality.
IMPRESSION: Total Agatston score: 16

[HOSPITAL] percentile: 34

Scattered aortic root calcifications.

No acute extra cardiac abnormality.

## 2022-03-15 DIAGNOSIS — M25512 Pain in left shoulder: Secondary | ICD-10-CM | POA: Diagnosis not present

## 2022-04-12 DIAGNOSIS — M25512 Pain in left shoulder: Secondary | ICD-10-CM | POA: Diagnosis not present

## 2022-04-12 DIAGNOSIS — M25531 Pain in right wrist: Secondary | ICD-10-CM | POA: Diagnosis not present

## 2022-04-16 DIAGNOSIS — H524 Presbyopia: Secondary | ICD-10-CM | POA: Diagnosis not present

## 2022-04-16 DIAGNOSIS — H11153 Pinguecula, bilateral: Secondary | ICD-10-CM | POA: Diagnosis not present

## 2022-04-16 DIAGNOSIS — H2513 Age-related nuclear cataract, bilateral: Secondary | ICD-10-CM | POA: Diagnosis not present

## 2022-05-16 DIAGNOSIS — M79672 Pain in left foot: Secondary | ICD-10-CM | POA: Diagnosis not present

## 2022-05-16 DIAGNOSIS — G5782 Other specified mononeuropathies of left lower limb: Secondary | ICD-10-CM | POA: Diagnosis not present

## 2022-05-19 DIAGNOSIS — G5782 Other specified mononeuropathies of left lower limb: Secondary | ICD-10-CM | POA: Diagnosis not present

## 2022-08-16 DIAGNOSIS — R3911 Hesitancy of micturition: Secondary | ICD-10-CM | POA: Diagnosis not present

## 2022-09-02 ENCOUNTER — Telehealth: Payer: Medicare Other | Admitting: Family

## 2022-09-02 ENCOUNTER — Encounter: Payer: Self-pay | Admitting: Family

## 2022-09-02 VITALS — Temp 97.7°F | Ht 73.0 in | Wt 190.0 lb

## 2022-09-02 DIAGNOSIS — J069 Acute upper respiratory infection, unspecified: Secondary | ICD-10-CM | POA: Diagnosis not present

## 2022-09-02 DIAGNOSIS — U071 COVID-19: Secondary | ICD-10-CM | POA: Diagnosis not present

## 2022-09-02 MED ORDER — ZINC GLUCONATE 50 MG PO TABS: 50.0000 mg | ORAL_TABLET | Freq: Every day | ORAL | 0 refills | Status: AC

## 2022-09-02 MED ORDER — VITAMIN C 1000 MG PO TABS: 1000.0000 mg | ORAL_TABLET | Freq: Every day | ORAL | 0 refills | Status: AC

## 2022-09-02 MED ORDER — VITAMIN D3 50 MCG (2000 UT) PO CAPS: 2000.0000 [IU] | ORAL_CAPSULE | Freq: Every day | ORAL | 0 refills | Status: AC

## 2022-09-02 MED ORDER — NIRMATRELVIR/RITONAVIR (PAXLOVID)TABLET: 3.0000 | ORAL_TABLET | Freq: Two times a day (BID) | ORAL | 0 refills | Status: AC

## 2022-09-02 NOTE — Patient Instructions (Addendum)
-   Please Hold Simvastatin 40 mg tablet for 5 days while taking Paxlovid then resume after completing paxlovid. - Take Zinc 50 mg tablet one by mouth daily for 5 days  - Take Vitamin C 1000 mg tablet one by mouth twice daily x 10 days - Increase vitamin D to 2000 units daily x 5 days then resume 1000 units daily - Tylenol as needed for fever or body aches  - increase your fruits intake in your diet  - increase your water intake to 6-8 glasses of water daily  - Notify provider or go to ED if you develop any chest tightness,chest pain or shortness of breath  - Continue to practice social distance,self quarantine for 5 days,wear facial mask,hand hygiene and wipe counter top surfaces per CDC guidelines

## 2022-09-02 NOTE — Progress Notes (Signed)
This service is provided via telemedicine  No vital signs collected/recorded due to the encounter was a telemedicine visit.   Location of patient (ex: home, work):  home  Patient consents to a telephone visit:  yes  Location of the provider (ex: office, home):  [facility]  Names of all persons participating in the telemedicine service and their role in the encounter:  Patient, [name], Melissa Pickett CCMA,CMAA, [place] Mokry,NP  Time spent on call:  5 min    Provider: Junia Nygren FNP-C  Frederica Kuster, MD  Patient Care Team: Frederica Kuster, MD as PCP - General (Family Medicine)  Extended Emergency Contact Information Primary Emergency Contact: Lacretia Nicks [place] of [place] Home Phone: [phone] Relation: Son Secondary Emergency Contact: Conception Oms Address: [address]          [place], [place] [address] [place] of [place] Home Phone: [phone] Work Phone: [phone] Mobile Phone: [phone] Relation: Significant other Preferred language: English  Code Status:  Full Code  Goals of care: Advanced Directive information    05/24/2021    2:33 PM  Advanced Directives  Does Patient Have a Medical Advance Directive? No;Yes  Type of Estate agent of [place];Living will  Copy of Healthcare Power of Attorney in Chart? No - copy requested     Chief Complaint  Patient presents with   Acute Visit    Patient is here for positive covid test on 08/30/2022    HPI:  Pt is a 67 y.o. male seen today for an acute visit for evaluation of positive COVID-19 Test 08/30/2022.states travelled to [place] to visit the mother who is in the Rest [facility].went out to eat at restaurant. Her sister also tested positive since they travelled together.  Has sore throat,dry cough ,congestion and raspy wheeze. He denies any fever ,chills, wheezing or shortness  of breath.  Had snack,crackers and  fruit.   Past Medical History:  Diagnosis Date   BPH (benign prostatic hyperplasia)    Hyperlipemia    Neuroma    Left and right   Past Surgical History:  Procedure Laterality Date   COLONOSCOPY  2019   ELBOW SURGERY     right-tennis elbow   FINGER SURGERY  2013   index finger on right hand/ ligament repair   HARDWARE REMOVAL  08/15/2011   Procedure: HARDWARE REMOVAL;  Surgeon: Wyn Forster., MD;  Location: [facility];  Service: Orthopedics;  Laterality: Right;  pin removal right index metacarpal phalangeal joint   NASAL SINUS SURGERY     x3   PROSTATE SURGERY     SEPTOPLASTY      Allergies  Allergen Reactions   Other     Pollen    Outpatient Encounter Medications as of 09/02/2022  Medication Sig   finasteride (PROSCAR) 5 MG tablet Take 5 mg by mouth daily.   melatonin 5 MG TABS Take 5 mg by mouth.   Multiple Vitamin (MULTIVITAMIN) capsule Take 1 capsule by mouth daily.   simvastatin (ZOCOR) 40 MG tablet Take 1 tablet (40 mg total) by mouth every evening.   tamsulosin (FLOMAX) 0.4 MG CAPS capsule Take 0.4 mg by mouth daily.   No facility-administered encounter medications on file as of 09/02/2022.    Review of Systems  Constitutional:  Negative for appetite change, chills, fatigue, fever and unexpected weight change.  HENT:  Positive for congestion and sore throat. Negative for dental problem, ear discharge, ear pain, facial  swelling, hearing loss, nosebleeds, postnasal drip, rhinorrhea, sinus pressure, sinus pain, sneezing, tinnitus and trouble swallowing.   Eyes:  Negative for pain, discharge, redness, itching and visual disturbance.  Respiratory:  Positive for cough. Negative for chest tightness, shortness of breath and wheezing.   Cardiovascular:  Negative for chest pain, palpitations and leg swelling.  Gastrointestinal:  Negative for abdominal distention, abdominal pain, diarrhea, nausea and vomiting.  Musculoskeletal:  Negative for gait  problem, joint swelling and myalgias.  Neurological:  Negative for dizziness, weakness, light-headedness and headaches.    Immunization History  Administered Date(s) Administered   PFIZER(Purple Top)SARS-COV-2 Vaccination 05/06/2019, 05/27/2019   Tdap 11/14/2014, 05/02/2018   Pertinent  Health Maintenance Due  Topic Date Due   INFLUENZA VACCINE  09/19/2022   Colonoscopy  01/20/2028      05/24/2021    2:33 PM 09/25/2021    8:52 AM  Fall Risk  Falls in the past year? 0 0  Was there an injury with Fall? 0 0  Fall Risk Category Calculator 0 0  Fall Risk Category (Retired) Low Low  (RETIRED) Patient Fall Risk Level Low fall risk Low fall risk  Patient at Risk for Falls Due to No Fall Risks No Fall Risks  Fall risk Follow up Falls evaluation completed Falls evaluation completed   Functional Status Survey:    Vitals:   09/02/22 1304  Temp: 97.7 F (36.5 C)  Weight: 190 lb (86.2 kg)  Height: 6\' 1"  (1.854 m)   Body mass index is 25.07 kg/m. Physical Exam Constitutional:      General: He is not in acute distress.    Appearance: He is not ill-appearing.  Pulmonary:     Effort: Pulmonary effort is normal. No respiratory distress.  Neurological:     Mental Status: He is alert and oriented to person, place, and time.  Psychiatric:        Mood and Affect: Mood normal.        Behavior: Behavior normal.     Labs reviewed: Recent Labs    09/25/21 0940  NA 138  K 4.2  CL 101  CO2 26  GLUCOSE 94  BUN 14  CREATININE 0.94  CALCIUM 9.5   Recent Labs    09/25/21 0940  AST 19  ALT 26  BILITOT 0.9  PROT 6.6   No results for input(s): "WBC", "NEUTROABS", "HGB", "HCT", "MCV", "PLT" in the last 8760 hours. Lab Results  Component Value Date   TSH 3.23 08/29/2020   No results found for: "HGBA1C" Lab Results  Component Value Date   CHOL 195 09/25/2021   HDL 72 09/25/2021   LDLCALC 98 09/25/2021   TRIG 146 09/25/2021   CHOLHDL 2.7 09/25/2021    Significant  Diagnostic Results in last 30 days:  No results found.  Assessment/Plan 1. COVID-19 virus infection - Please Hold Simvastatin 40 mg tablet for 5 days while taking Paxlovid then resume after completing paxlovid. - Take Zinc 50 mg tablet one by mouth daily for 5 days  - Take Vitamin C 1000 mg tablet one by mouth twice daily x 10 days - Increase vitamin D to 2000 units daily x 5 days then resume 1000 units daily - Tylenol as needed for fever or body aches  - increase your fruits intake in your diet  - increase your water intake to 6-8 glasses of water daily  - Notify provider or go to ED if you develop any chest tightness,chest pain or shortness of breath  - Continue  to practice social distance,self quarantine for 5 days,wear facial mask,hand hygiene and wipe counter top surfaces per CDC guidelines  - nirmatrelvir/ritonavir (PAXLOVID) 20 x 150 MG & 10 x 100MG  TABS; Take 3 tablets by mouth 2 (two) times daily for 5 days. (Take nirmatrelvir 150 mg two tablets twice daily for 5 days and ritonavir 100 mg one tablet twice daily for 5 days) Patient GFR is 90  Dispense: 30 tablet; Refill: 0  2. Upper respiratory tract infection due to COVID-19 virus - take above supplement as directed.  - Cholecalciferol (VITAMIN D3) 50 MCG (2000 UT) capsule; Take 1 capsule (2,000 Units total) by mouth daily for 5 days.  Dispense: 5 capsule; Refill: 0 - zinc gluconate 50 MG tablet; Take 1 tablet (50 mg total) by mouth daily for 5 days.  Dispense: 5 tablet; Refill: 0 - nirmatrelvir/ritonavir (PAXLOVID) 20 x 150 MG & 10 x 100MG  TABS; Take 3 tablets by mouth 2 (two) times daily for 5 days. (Take nirmatrelvir 150 mg two tablets twice daily for 5 days and ritonavir 100 mg one tablet twice daily for 5 days) Patient GFR is 90  Dispense: 30 tablet; Refill: 0 - Ascorbic Acid (VITAMIN C) 1000 MG tablet; Take 1 tablet (1,000 mg total) by mouth daily for 10 days.  Dispense: 10 tablet; Refill: 0  Family/ staff Communication:  Reviewed plan of care with patient verbalized understanding   Labs/tests ordered: None   Next Appointment: Return if symptoms worsen or fail to improve.  I connected with  [name] on 09/02/22 by a video enabled telemedicine application and verified that I am speaking with the correct person using two identifiers.   I discussed the limitations of evaluation and management by telemedicine. The patient expressed understanding and agreed to proceed.   Spent 11 minutes of face to face with patient  >50% time spent counseling; reviewing medical record; tests; labs; and developing future plan of care.   Caesar Bookman, NP

## 2022-09-19 DIAGNOSIS — G5782 Other specified mononeuropathies of left lower limb: Secondary | ICD-10-CM | POA: Diagnosis not present

## 2022-10-02 ENCOUNTER — Ambulatory Visit (INDEPENDENT_AMBULATORY_CARE_PROVIDER_SITE_OTHER): Payer: Medicare Other | Admitting: Family Medicine

## 2022-10-02 ENCOUNTER — Encounter: Payer: Self-pay | Admitting: Family Medicine

## 2022-10-02 VITALS — BP 124/88 | HR 72 | Temp 97.7°F | Resp 17 | Ht 73.0 in | Wt 204.0 lb

## 2022-10-02 DIAGNOSIS — E782 Mixed hyperlipidemia: Secondary | ICD-10-CM | POA: Diagnosis not present

## 2022-10-02 DIAGNOSIS — G5761 Lesion of plantar nerve, right lower limb: Secondary | ICD-10-CM

## 2022-10-02 NOTE — Progress Notes (Signed)
Provider:  Jacalyn Lefevre, MD  Careteam: Patient Care Team: Frederica Kuster, MD as PCP - General (Family Medicine)  PLACE OF SERVICE:  [facility]  Advanced Directive information Does Patient Have a Medical Advance Directive?: Yes, Type of Advance Directive: Healthcare Power of [place];Living will;Out of facility DNR (pink MOST or yellow form), Does patient want to make changes to medical advance directive?: No - Patient declined  Allergies  Allergen Reactions   Other     Pollen    Chief Complaint  Patient presents with   Annual Exam    PHYSICAL   Health Maintenance    Discuss the need for Hepatitis C Screening, and AWV.   Immunizations    Discuss the need for Shingrix vaccine, Covid Booster, and Pne vaccine.    Concern     Patient slipped on flip flops and fell on left knee. Has some pain.      HPI: Patient is a 67 y.o. male patient here for medical management of chronic problems including BPH, hyperlipidemia. Also fell about a month ago slipping in the rain leg was hyperextended.  There was no twisting or torque.  Had initial swelling.  No history of knee giving way or locking since.  Review of Systems:  Review of Systems  Constitutional: Negative.   HENT:  Positive for hearing loss.   Respiratory: Negative.    Cardiovascular: Negative.   Neurological: Negative.   Psychiatric/Behavioral: Negative.    All other systems reviewed and are negative.   Past Medical History:  Diagnosis Date   BPH (benign prostatic hyperplasia)    Hyperlipemia    Neuroma    Left and right   Past Surgical History:  Procedure Laterality Date   COLONOSCOPY  2019   ELBOW SURGERY     right-tennis elbow   FINGER SURGERY  2013   index finger on right hand/ ligament repair   HARDWARE REMOVAL  08/15/2011   Procedure: HARDWARE REMOVAL;  Surgeon: Wyn Forster., MD;  Location: [facility];  Service: Orthopedics;  Laterality: Right;  pin removal right index  metacarpal phalangeal joint   NASAL SINUS SURGERY     x3   PROSTATE SURGERY     SEPTOPLASTY     Social History:   reports that he quit smoking about 12 years ago. His smoking use included cigars. He has never used smokeless tobacco. He reports current alcohol use of about 7.0 standard drinks of alcohol per week. He reports that he does not use drugs.  Family History  Problem Relation Age of Onset   Dementia Mother    Hypertension Father    Heart disease Father    Colon cancer Sister    Healthy Son    Healthy Daughter     Medications: Patient's Medications  New Prescriptions   No medications on file  Previous Medications   FINASTERIDE (PROSCAR) 5 MG TABLET    Take 5 mg by mouth daily.   MELATONIN 5 MG TABS    Take 5 mg by mouth.   MULTIPLE VITAMIN (MULTIVITAMIN) CAPSULE    Take 1 capsule by mouth daily.   SIMVASTATIN (ZOCOR) 40 MG TABLET    Take 1 tablet (40 mg total) by mouth every evening.   TAMSULOSIN (FLOMAX) 0.4 MG CAPS CAPSULE    Take 0.4 mg by mouth in the morning and at bedtime.  Modified Medications   No medications on file  Discontinued Medications   No medications on file  Physical Exam:  Vitals:   10/02/22 0956  BP: 124/88  Pulse: 72  Resp: 17  Temp: 97.7 F (36.5 C)  SpO2: 98%  Weight: 204 lb (92.5 kg)  Height: 6\' 1"  (1.854 m)   Body mass index is 26.91 kg/m. Wt Readings from Last 3 Encounters:  10/02/22 204 lb (92.5 kg)  09/02/22 190 lb (86.2 kg)  09/25/21 197 lb (89.4 kg)    Physical Exam Vitals and nursing note reviewed.  Constitutional:      Appearance: Normal appearance.  HENT:     Head: Normocephalic.  Eyes:     Extraocular Movements: Extraocular movements intact.     Conjunctiva/sclera: Conjunctivae normal.     Pupils: Pupils are equal, round, and reactive to light.  Cardiovascular:     Rate and Rhythm: Normal rate and regular rhythm.     Pulses: Normal pulses.  Pulmonary:     Effort: Pulmonary effort is normal.     Breath  sounds: Normal breath sounds.  Abdominal:     General: Bowel sounds are normal.     Palpations: Abdomen is soft.  Musculoskeletal:        General: Normal range of motion.     Comments: Left knee: No edema.  No tenderness.  Anterior drawer negative Valgus varus stress negative  Skin:    General: Skin is warm and dry.  Neurological:     General: No focal deficit present.     Mental Status: He is alert and oriented to person, place, and time.  Psychiatric:        Mood and Affect: Mood normal.        Behavior: Behavior normal.     Labs reviewed: Basic Metabolic Panel: No results for input(s): "NA", "K", "CL", "CO2", "GLUCOSE", "BUN", "CREATININE", "CALCIUM", "MG", "PHOS", "TSH" in the last 8760 hours. Liver Function Tests: No results for input(s): "AST", "ALT", "ALKPHOS", "BILITOT", "PROT", "ALBUMIN" in the last 8760 hours. No results for input(s): "LIPASE", "AMYLASE" in the last 8760 hours. No results for input(s): "AMMONIA" in the last 8760 hours. CBC: No results for input(s): "WBC", "NEUTROABS", "HGB", "HCT", "MCV", "PLT" in the last 8760 hours. Lipid Panel: No results for input(s): "CHOL", "HDL", "LDLCALC", "TRIG", "CHOLHDL", "LDLDIRECT" in the last 8760 hours. TSH: No results for input(s): "TSH" in the last 8760 hours. A1C: No results found for: "HGBA1C"   Assessment/Plan  1. Moderate mixed hyperlipidemia not requiring statin therapy We will check lipids again today.  Goal would be for LDL to be 75 or 70 with positive family history may want to increase statin to more potent formulation  2. Morton's neuroma of right foot Has seen podiatrist in [place].  Status post several steroid injections but symptoms persist.  Patient desires second opinion   Jacalyn Lefevre, MD [facility] [phone]

## 2022-10-03 LAB — LIPID PANEL
Cholesterol: 216 mg/dL — ABNORMAL HIGH (ref <200)
HDL: 74 mg/dL (ref >=40)
LDL Cholesterol (Calc): 123 mg/dL — ABNORMAL HIGH
Non-HDL Cholesterol (Calc): 142 mg/dL — ABNORMAL HIGH (ref <130)
Total CHOL/HDL Ratio: 2.9 (calc) (ref <5.0)
Triglycerides: 91 mg/dL (ref <150)

## 2022-10-03 LAB — COMPREHENSIVE METABOLIC PANEL
AG Ratio: 2.5 (calc) (ref 1.0–2.5)
ALT: 24 U/L (ref 9–46)
AST: 21 U/L (ref 10–35)
Albumin: 4.7 g/dL (ref 3.6–5.1)
Alkaline phosphatase (APISO): 57 U/L (ref 35–144)
BUN: 16 mg/dL (ref 7–25)
CO2: 26 mmol/L (ref 20–32)
Calcium: 9.8 mg/dL (ref 8.6–10.3)
Chloride: 106 mmol/L (ref 98–110)
Creat: 0.94 mg/dL (ref 0.70–1.35)
Globulin: 1.9 g/dL (ref 1.9–3.7)
Glucose, Bld: 101 mg/dL — ABNORMAL HIGH (ref 65–99)
Potassium: 4.8 mmol/L (ref 3.5–5.3)
Sodium: 140 mmol/L (ref 135–146)
Total Bilirubin: 0.7 mg/dL (ref 0.2–1.2)
Total Protein: 6.6 g/dL (ref 6.1–8.1)

## 2022-10-04 ENCOUNTER — Other Ambulatory Visit: Payer: Self-pay

## 2022-10-04 DIAGNOSIS — E7800 Pure hypercholesterolemia, unspecified: Secondary | ICD-10-CM

## 2022-10-09 ENCOUNTER — Telehealth: Payer: Self-pay

## 2022-10-09 ENCOUNTER — Other Ambulatory Visit: Payer: Self-pay | Admitting: Family Medicine

## 2022-10-09 DIAGNOSIS — E7800 Pure hypercholesterolemia, unspecified: Secondary | ICD-10-CM

## 2022-10-09 MED ORDER — ATORVASTATIN CALCIUM 20 MG PO TABS
20.0000 mg | ORAL_TABLET | Freq: Every day | ORAL | 3 refills | Status: AC
Start: 2022-10-09 — End: ?

## 2022-10-09 NOTE — Progress Notes (Signed)
Rx for atorvastatin 20 mg sent to pharmacy.  Patient needs more potent statin based on lipid levels

## 2022-10-09 NOTE — Telephone Encounter (Signed)
Patient called stating that there was supposed to be a prescription sent to the pharmacy for a different statin medication. Medication wasn't sent to pharmacy. Please send prescription to [facility].   Message sent to Dr. Jacalyn Lefevre

## 2022-10-10 ENCOUNTER — Other Ambulatory Visit: Payer: Self-pay | Admitting: Podiatry

## 2022-10-10 ENCOUNTER — Ambulatory Visit (INDEPENDENT_AMBULATORY_CARE_PROVIDER_SITE_OTHER): Payer: Medicare Other | Admitting: Podiatry

## 2022-10-10 ENCOUNTER — Encounter: Payer: Self-pay | Admitting: Podiatry

## 2022-10-10 ENCOUNTER — Ambulatory Visit (INDEPENDENT_AMBULATORY_CARE_PROVIDER_SITE_OTHER): Payer: Medicare Other

## 2022-10-10 DIAGNOSIS — M79672 Pain in left foot: Secondary | ICD-10-CM

## 2022-10-10 DIAGNOSIS — G5762 Lesion of plantar nerve, left lower limb: Secondary | ICD-10-CM | POA: Diagnosis not present

## 2022-10-10 DIAGNOSIS — M79671 Pain in right foot: Secondary | ICD-10-CM

## 2022-10-10 DIAGNOSIS — M7989 Other specified soft tissue disorders: Secondary | ICD-10-CM | POA: Diagnosis not present

## 2022-10-10 DIAGNOSIS — M19072 Primary osteoarthritis, left ankle and foot: Secondary | ICD-10-CM | POA: Diagnosis not present

## 2022-10-10 NOTE — Progress Notes (Signed)
  Subjective:  Patient ID: [name], male    DOB: 01/05/1956,   MRN: [id number]  Chief Complaint  Patient presents with   Toe Pain    Pt present today for left foot pain, pt stated that he gets a burning sensation in his fourth toe on the the left.     67 y.o. male presents for second opinion of left foot pain. Relates about 5 years he has been dealing with possible neuromas in both feet. Relates he has had multiple injections in the feet and the right has cleared up. However he is still having pain in the left. Has had four injections on the left with no relief. Most recently saw Dr. Flossie Dibble and was advised to come here for a second opinion on possible surgery. Patient has not had an MRI done Denies any other pedal complaints. Denies n/v/f/c.   Past Medical History:  Diagnosis Date   BPH (benign prostatic hyperplasia)    Hyperlipemia    Neuroma    Left and right    Objective:  Physical Exam: Vascular: DP/PT pulses 2/4 bilateral. CFT <3 seconds. Normal hair growth on digits. No edema.  Skin. No lacerations or abrasions bilateral feet.  Musculoskeletal: MMT 5/5 bilateral lower extremities in DF, PF, Inversion and Eversion. Deceased ROM in DF of ankle joint. Tender to the third interspace on the left pain with metatarsal squeeze and mild mulders click noted. There is some tenderness in the second interspace as well today. Diminished sensation noted in the third and fourth digits today.  Neurological: Sensation intact to light touch.   Assessment:   1. Morton's neuroma of left foot      Plan:  Patient was evaluated and treated and all questions answered. Discussed neuroma and treatment options with patient.  Radiographs reviewed and discussed with patient. No acute fractures or dislocations noted.  Injection deferred today as he recently had an injection in the area.  Continue padding and anti-inflammatories.  Discussed with patient given his course and unable to improve  with injections likely surgery would be his next best option. Discussed getting and MRI for surgical planning prior to this.  Patient in agreement Order for MRI placed. .  Will return after MRI.  Louann Sjogren, DPM

## 2022-10-15 ENCOUNTER — Ambulatory Visit
Admission: RE | Admit: 2022-10-15 | Discharge: 2022-10-15 | Disposition: A | Discharge status: Home / Self Care | Payer: Medicare Other | Source: Ambulatory Visit | Attending: Podiatry | Admitting: Podiatry

## 2022-10-15 DIAGNOSIS — G5762 Lesion of plantar nerve, left lower limb: Secondary | ICD-10-CM

## 2022-10-15 DIAGNOSIS — M79672 Pain in left foot: Secondary | ICD-10-CM | POA: Diagnosis not present

## 2022-10-15 DIAGNOSIS — G8929 Other chronic pain: Secondary | ICD-10-CM | POA: Diagnosis not present

## 2022-10-15 DIAGNOSIS — M19072 Primary osteoarthritis, left ankle and foot: Secondary | ICD-10-CM | POA: Diagnosis not present

## 2022-10-15 DIAGNOSIS — R2242 Localized swelling, mass and lump, left lower limb: Secondary | ICD-10-CM | POA: Diagnosis not present

## 2022-10-16 ENCOUNTER — Ambulatory Visit: Payer: Medicare Other | Admitting: Podiatry

## 2022-12-03 ENCOUNTER — Other Ambulatory Visit: Payer: Self-pay

## 2022-12-03 DIAGNOSIS — E7800 Pure hypercholesterolemia, unspecified: Secondary | ICD-10-CM

## 2022-12-04 ENCOUNTER — Other Ambulatory Visit: Payer: Medicare Other

## 2022-12-04 DIAGNOSIS — E7800 Pure hypercholesterolemia, unspecified: Secondary | ICD-10-CM | POA: Diagnosis not present

## 2022-12-05 LAB — LIPID PANEL
Cholesterol: 201 mg/dL — ABNORMAL HIGH (ref <200)
HDL: 68 mg/dL (ref >=40)
LDL Cholesterol (Calc): 114 mg/dL — ABNORMAL HIGH
Non-HDL Cholesterol (Calc): 133 mg/dL — ABNORMAL HIGH (ref <130)
Total CHOL/HDL Ratio: 3 (calc) (ref <5.0)
Triglycerides: 87 mg/dL (ref <150)

## 2023-01-17 ENCOUNTER — Other Ambulatory Visit: Payer: Self-pay

## 2023-01-17 ENCOUNTER — Ambulatory Visit
Admission: RE | Admit: 2023-01-17 | Discharge: 2023-01-17 | Disposition: A | Discharge status: Home / Self Care | Payer: Medicare Other | Source: Ambulatory Visit | Attending: Family Medicine | Admitting: Family Medicine

## 2023-01-17 ENCOUNTER — Ambulatory Visit (INDEPENDENT_AMBULATORY_CARE_PROVIDER_SITE_OTHER): Payer: Medicare Other

## 2023-01-17 VITALS — BP 125/82 | HR 74 | Temp 98.1°F | Resp 18

## 2023-01-17 DIAGNOSIS — J4521 Mild intermittent asthma with (acute) exacerbation: Secondary | ICD-10-CM

## 2023-01-17 DIAGNOSIS — R059 Cough, unspecified: Secondary | ICD-10-CM | POA: Diagnosis not present

## 2023-01-17 DIAGNOSIS — J069 Acute upper respiratory infection, unspecified: Secondary | ICD-10-CM

## 2023-01-17 DIAGNOSIS — R062 Wheezing: Secondary | ICD-10-CM | POA: Diagnosis not present

## 2023-01-17 MED ORDER — BENZONATATE 100 MG PO CAPS: 100.0000 mg | ORAL_CAPSULE | Freq: Three times a day (TID) | ORAL | 0 refills | Status: AC | PRN

## 2023-01-17 MED ORDER — PREDNISONE 20 MG PO TABS: 40.0000 mg | ORAL_TABLET | Freq: Every day | ORAL | 0 refills | Status: AC

## 2023-01-17 MED ORDER — ALBUTEROL SULFATE HFA 108 (90 BASE) MCG/ACT IN AERS: 2.0000 | INHALATION_SPRAY | RESPIRATORY_TRACT | 0 refills | Status: AC | PRN

## 2023-01-17 NOTE — ED Provider Notes (Signed)
[facility]    CSN: [id number] Arrival date & time: 01/17/23  1329      History   Chief Complaint Chief Complaint  Patient presents with   Cough    Cough, wheezing, head congestion - Entered by patient    HPI [name] is a 67 y.o. male.    Cough Here cough and congestion and wheezing.  Symptoms began 2 days ago and he has had a lot of trouble with myalgia and wheezing.  No fever or chills noted.  No nausea or vomiting or diarrhea.   He has had to use an inhaler when he had COVID about 1 year ago.  No COPD or asthma diagnosed per patient, but he does have a history of smoking in the past.  He is not a current smoker.  COVID test that were negative.  Past Medical History:  Diagnosis Date   BPH (benign prostatic hyperplasia)    Hyperlipemia    Neuroma    Left and right    Patient Active Problem List   Diagnosis Date Noted   Cough 09/25/2021   Hyperlipidemia 09/25/2021   BPH (benign prostatic hyperplasia)    No pertinent past medical history 09/08/2020   Changing skin lesion 02/08/2019   Lipoma 09/07/2007   Anxiety state 09/07/2007   ABNORMAL FINDINGS GI TRACT 09/07/2007    Past Surgical History:  Procedure Laterality Date   COLONOSCOPY  2019   ELBOW SURGERY     right-tennis elbow   FINGER SURGERY  2013   index finger on right hand/ ligament repair   HARDWARE REMOVAL  08/15/2011   Procedure: HARDWARE REMOVAL;  Surgeon: Wyn Forster., MD;  Location: [facility];  Service: Orthopedics;  Laterality: Right;  pin removal right index metacarpal phalangeal joint   NASAL SINUS SURGERY     x3   PROSTATE SURGERY     SEPTOPLASTY         Home Medications    Prior to Admission medications   Medication Sig Start Date End Date Taking? Authorizing Provider  albuterol (VENTOLIN HFA) 108 (90 Base) MCG/ACT inhaler Inhale 2 puffs into the lungs every 4 (four) hours as needed for wheezing or shortness of breath. 01/17/23  Yes  Zenia Resides, MD  atorvastatin (LIPITOR) 20 MG tablet Take 1 tablet (20 mg total) by mouth daily. 10/09/22  Yes Frederica Kuster, MD  benzonatate (TESSALON) 100 MG capsule Take 1 capsule (100 mg total) by mouth 3 (three) times daily as needed for cough. 01/17/23  Yes Zenia Resides, MD  finasteride (PROSCAR) 5 MG tablet Take 5 mg by mouth daily. 03/06/19  Yes [provider]  glucosamine-chondroitin 500-400 MG tablet Take by mouth. 10/16/21  Yes [provider]  melatonin 5 MG TABS Take 5 mg by mouth.   Yes [provider]  Multiple Vitamin (MULTIVITAMIN) capsule Take 1 capsule by mouth daily.   Yes [provider]  predniSONE (DELTASONE) 20 MG tablet Take 2 tablets (40 mg total) by mouth daily with breakfast for 5 days. 01/17/23 01/22/23 Yes Zenia Resides, MD  tamsulosin (FLOMAX) 0.4 MG CAPS capsule Take 0.4 mg by mouth in the morning and at bedtime.   Yes [provider]  simvastatin (ZOCOR) 40 MG tablet Take 1 tablet (40 mg total) by mouth every evening. 11/07/21   Frederica Kuster, MD    Family History Family History  Problem Relation Age of Onset   Dementia Mother  Hypertension Father    Heart disease Father    Colon cancer Sister    Healthy Son    Healthy Daughter     Social History Social History   Tobacco Use   Smoking status: Former    Types: Cigars    Quit date: 2012    Years since quitting: 12.9   Smokeless tobacco: Never  Vaping Use   Vaping status: Every Day  Substance Use Topics   Alcohol use: Yes    Alcohol/week: 7.0 standard drinks of alcohol    Types: 7 Cans of beer per week   Drug use: Never     Allergies   Other   Review of Systems Review of Systems  Respiratory:  Positive for cough.      Physical Exam Triage Vital Signs ED Triage Vitals  Encounter Vitals Group     BP 01/17/23 1351 125/82     Systolic BP Percentile --      Diastolic BP Percentile --      Pulse Rate 01/17/23 1351 74      Resp 01/17/23 1351 18     Temp 01/17/23 1351 98.1 F (36.7 C)     Temp Source 01/17/23 1351 Oral     SpO2 01/17/23 1351 96 %     Weight --      Height --      Head Circumference --      Peak Flow --      Pain Score 01/17/23 1347 0     Pain Loc --      Pain Education --      Exclude from Growth Chart --    No data found.  Updated Vital Signs BP 125/82 (BP Location: Right Arm)   Pulse 74   Temp 98.1 F (36.7 C) (Oral)   Resp 18   SpO2 96%   Visual Acuity Right Eye Distance:   Left Eye Distance:   Bilateral Distance:    Right Eye Near:   Left Eye Near:    Bilateral Near:     Physical Exam Vitals reviewed.  Constitutional:      General: He is not in acute distress.    Appearance: He is not toxic-appearing.  HENT:     Nose: Congestion present.     Mouth/Throat:     Mouth: Mucous membranes are moist.     Pharynx: No oropharyngeal exudate or posterior oropharyngeal erythema.  Eyes:     Extraocular Movements: Extraocular movements intact.     Conjunctiva/sclera: Conjunctivae normal.     Pupils: Pupils are equal, round, and reactive to light.  Cardiovascular:     Rate and Rhythm: Normal rate and regular rhythm.     Heart sounds: No murmur heard. Pulmonary:     Effort: No respiratory distress.     Breath sounds: No stridor. No rhonchi or rales.     Comments: Here some expiratory wheezes in the right upper lobe. Musculoskeletal:     Cervical back: Neck supple.  Lymphadenopathy:     Cervical: No cervical adenopathy.  Skin:    Capillary Refill: Capillary refill takes less than 2 seconds.     Coloration: Skin is not jaundiced or pale.  Neurological:     General: No focal deficit present.     Mental Status: He is alert and oriented to person, place, and time.  Psychiatric:        Behavior: Behavior normal.      UC Treatments / Results  Labs (all labs  ordered are listed, but only abnormal results are displayed) Labs Reviewed - No data to  display  EKG   Radiology DG Chest 2 View  Result Date: 01/17/2023 CLINICAL DATA:  Cough and wheezing for 2 days. EXAM: CHEST - 2 VIEW COMPARISON:  None Available. FINDINGS: The heart size and mediastinal contours are within normal limits. Both lungs are clear. The visualized skeletal structures are unremarkable. IMPRESSION: No active cardiopulmonary disease. Electronically Signed   By: Lupita Raider M.D.   On: 01/17/2023 14:55    Procedures Procedures (including critical care time)  Medications Ordered in UC Medications - No data to display  Initial Impression / Assessment and Plan / UC Course  I have reviewed the triage vital signs and the nursing notes.  Pertinent labs & imaging results that were available during my care of the patient were reviewed by me and considered in my medical decision making (see chart for details).     X-rays negative for pneumonia or fluid.  Prednisone burst for 5 days and albuterol sent in for COPD or asthma exacerbation and Tessalon Perles are sent in for cough. Final Clinical Impressions(s) / UC Diagnoses   Final diagnoses:  Viral URI  Mild intermittent asthma with acute exacerbation     Discharge Instructions      Your chest x-ray did not show any sign of pneumonia or fluid.  Albuterol inhaler--do 2 puffs every 4 hours as needed for shortness of breath or wheezing  Take prednisone 20 mg--2 daily for 5 days; this is for inflammation in your lungs  Take benzonatate 100 mg, 1 tab every 8 hours as needed for cough.      ED Prescriptions     Medication Sig Dispense Auth. Provider   albuterol (VENTOLIN HFA) 108 (90 Base) MCG/ACT inhaler Inhale 2 puffs into the lungs every 4 (four) hours as needed for wheezing or shortness of breath. 1 each Zenia Resides, MD   predniSONE (DELTASONE) 20 MG tablet Take 2 tablets (40 mg total) by mouth daily with breakfast for 5 days. 10 tablet Zenia Resides, MD   benzonatate (TESSALON) 100 MG  capsule Take 1 capsule (100 mg total) by mouth 3 (three) times daily as needed for cough. 21 capsule Zenia Resides, MD      PDMP not reviewed this encounter.   Zenia Resides, MD 01/17/23 [phone]

## 2023-01-17 NOTE — ED Triage Notes (Signed)
Pt presents to UC w/ c/o sore throat 4 days ago, with the past 2 nights of dry coughing, wheezing, and body aches. Home covid and flu tests negative. Claritin and albuterol inhaler helped some.

## 2023-01-17 NOTE — Discharge Instructions (Addendum)
Your chest x-ray did not show any sign of pneumonia or fluid.  Albuterol inhaler--do 2 puffs every 4 hours as needed for shortness of breath or wheezing  Take prednisone 20 mg--2 daily for 5 days; this is for inflammation in your lungs  Take benzonatate 100 mg, 1 tab every 8 hours as needed for cough.

## 2023-01-20 DIAGNOSIS — G5782 Other specified mononeuropathies of left lower limb: Secondary | ICD-10-CM | POA: Diagnosis not present

## 2023-02-24 DIAGNOSIS — L2089 Other atopic dermatitis: Secondary | ICD-10-CM | POA: Diagnosis not present

## 2023-03-28 DIAGNOSIS — G5782 Other specified mononeuropathies of left lower limb: Secondary | ICD-10-CM | POA: Diagnosis not present

## 2023-08-25 DIAGNOSIS — G5782 Other specified mononeuropathies of left lower limb: Secondary | ICD-10-CM | POA: Diagnosis not present

## 2023-09-09 DIAGNOSIS — R3911 Hesitancy of micturition: Secondary | ICD-10-CM | POA: Diagnosis not present

## 2023-10-02 ENCOUNTER — Other Ambulatory Visit: Payer: Self-pay | Admitting: Sports Medicine

## 2023-10-02 DIAGNOSIS — E7800 Pure hypercholesterolemia, unspecified: Secondary | ICD-10-CM

## 2023-10-02 NOTE — Telephone Encounter (Signed)
 Copied from CRM [id number]. Topic: Clinical - Medication Refill >> Oct 02, 2023  9:57 AM Carrielelia G wrote: Medication: atorvastatin (LIPITOR) 20 MG tablet  (requesting a month, until patient gets established with new pcp)   Has the patient contacted their pharmacy? No (Agent: If no, request that the patient contact the pharmacy for the refill. If patient does not wish to contact the pharmacy document the reason why and proceed with request.) (Agent: If yes, when and what did the pharmacy advise?)  This is the patient's preferred pharmacy:  [id number] - [place], [place] - [address] [place] [address] Phone: [phone] Fax: [phone]  Is this the correct pharmacy for this prescription? No If no, delete pharmacy and type the correct one.    Is the patient out of the medication? Yes  Has the patient been seen for an appointment in the last year OR does the patient have an upcoming appointment? Yes  Can we respond through MyChart? Yes  Agent: Please be advised that Rx refills may take up to 3 business days. We ask that you follow-up with your pharmacy.

## 2023-10-07 ENCOUNTER — Encounter: Payer: Medicare Other | Admitting: Sports Medicine

## 2023-10-14 DIAGNOSIS — Z23 Encounter for immunization: Secondary | ICD-10-CM | POA: Diagnosis not present

## 2023-10-14 DIAGNOSIS — E785 Hyperlipidemia, unspecified: Secondary | ICD-10-CM | POA: Diagnosis not present

## 2023-10-14 DIAGNOSIS — Z87891 Personal history of nicotine dependence: Secondary | ICD-10-CM | POA: Diagnosis not present

## 2023-10-14 DIAGNOSIS — Z0000 Encounter for general adult medical examination without abnormal findings: Secondary | ICD-10-CM | POA: Diagnosis not present

## 2023-10-16 ENCOUNTER — Telehealth: Payer: Self-pay | Admitting: Sports Medicine

## 2023-10-16 NOTE — Telephone Encounter (Signed)
 Received a medical records request from [facility], [place], and forwarded it to medical records.

## 2023-11-27 DIAGNOSIS — Z87891 Personal history of nicotine dependence: Secondary | ICD-10-CM | POA: Diagnosis not present

## 2023-11-27 DIAGNOSIS — Z136 Encounter for screening for cardiovascular disorders: Secondary | ICD-10-CM | POA: Diagnosis not present

## 2023-11-27 DIAGNOSIS — F17200 Nicotine dependence, unspecified, uncomplicated: Secondary | ICD-10-CM | POA: Diagnosis not present

## 2023-12-18 ENCOUNTER — Telehealth: Payer: Self-pay

## 2023-12-18 NOTE — Telephone Encounter (Signed)
 Spoke with patient to make an appointment. He has found a PCP closer to his home.
